# Patient Record
Sex: Female | Born: 1995
Health system: Southern US, Community
[De-identification: ages and names within clinical notes are randomized; demographics above are authoritative.]

## PROBLEM LIST (undated history)

## (undated) DIAGNOSIS — T7840XA Allergy, unspecified, initial encounter: Secondary | ICD-10-CM

## (undated) DIAGNOSIS — M419 Scoliosis, unspecified: Secondary | ICD-10-CM

## (undated) DIAGNOSIS — L309 Dermatitis, unspecified: Secondary | ICD-10-CM

## (undated) DIAGNOSIS — M545 Low back pain, unspecified: Secondary | ICD-10-CM

## (undated) DIAGNOSIS — G8929 Other chronic pain: Secondary | ICD-10-CM

## (undated) HISTORY — DX: Dermatitis, unspecified: L30.9

## (undated) HISTORY — DX: Allergy, unspecified, initial encounter: T78.40XA

## (undated) HISTORY — DX: Low back pain: M54.5

## (undated) HISTORY — DX: Low back pain, unspecified: M54.50

## (undated) HISTORY — DX: Scoliosis, unspecified: M41.9

## (undated) HISTORY — DX: Other chronic pain: G89.29

---
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
## 2017-07-24 ENCOUNTER — Encounter: Payer: Self-pay | Admitting: Pediatrics

## 2017-07-24 ENCOUNTER — Ambulatory Visit (INDEPENDENT_AMBULATORY_CARE_PROVIDER_SITE_OTHER): Payer: Medicaid Other | Admitting: Pediatrics

## 2017-07-24 DIAGNOSIS — M545 Low back pain, unspecified: Secondary | ICD-10-CM | POA: Insufficient documentation

## 2017-07-24 DIAGNOSIS — L309 Dermatitis, unspecified: Secondary | ICD-10-CM

## 2017-07-24 DIAGNOSIS — Z0001 Encounter for general adult medical examination with abnormal findings: Secondary | ICD-10-CM

## 2017-07-24 DIAGNOSIS — J309 Allergic rhinitis, unspecified: Secondary | ICD-10-CM | POA: Diagnosis not present

## 2017-07-24 DIAGNOSIS — R8290 Unspecified abnormal findings in urine: Secondary | ICD-10-CM

## 2017-07-24 DIAGNOSIS — G8929 Other chronic pain: Secondary | ICD-10-CM | POA: Insufficient documentation

## 2017-07-24 LAB — POCT URINALYSIS DIPSTICK
Bilirubin, UA: NEGATIVE
GLUCOSE UA: NEGATIVE
KETONES UA: NEGATIVE
Leukocytes, UA: NEGATIVE
Nitrite, UA: NEGATIVE
Protein, UA: NEGATIVE
RBC UA: 200
SPEC GRAV UA: 1.015 (ref 1.010–1.025)
Urobilinogen, UA: 0.2 E.U./dL
pH, UA: 6.5 (ref 5.0–8.0)

## 2017-07-24 MED ORDER — TRIAMCINOLONE ACETONIDE 0.1 % EX CREA: TOPICAL_CREAM | CUTANEOUS | 1 refills | Status: DC

## 2017-07-24 MED ORDER — CETIRIZINE HCL 10 MG PO TABS: ORAL_TABLET | ORAL | 11 refills | Status: DC

## 2017-07-24 NOTE — Patient Instructions (Signed)
Preventive Care for [place], Female The transition to life after high school as a young adult can be a stressful time with many changes. You may start seeing a primary care physician instead of a pediatrician. This is the time when your health care becomes your responsibility. Preventive care refers to lifestyle choices and visits with your health care provider that can promote health and wellness. What does preventive care include?  A yearly physical exam. This is also called an annual wellness visit.  Dental exams once or twice a year.  Routine eye exams. Ask your health care provider how often you should have your eyes checked.  Personal lifestyle choices, including: ? Daily care of your teeth and gums. ? Regular physical activity. ? Eating a healthy diet. ? Avoiding tobacco and drug use. ? Avoiding or limiting alcohol use. ? Practicing safe sex. ? Taking vitamin and mineral supplements as recommended by your health care provider. What happens during an annual wellness visit? Preventive care starts with a yearly visit to your primary care physician. The services and screenings done by your health care provider during your annual wellness visit will depend on your overall health, lifestyle risk factors, and family history of disease. Counseling Your health care provider may ask you questions about:  Past medical problems and your family's medical history.  Medicines or supplements you take.  Health insurance and access to health care.  Alcohol, tobacco, and drug use.  Your safety at home, work, or school.  Access to firearms.  Emotional well-being and how you cope with stress.  Relationship well-being.  Diet, exercise, and sleep habits.  Your sexual health and activity.  Your methods of birth control.  Your menstrual cycle.  Your pregnancy history.  Screening You may have the following tests or measurements:  Height, weight, and BMI.  Blood  pressure.  Lipid and cholesterol levels.  Tuberculosis skin test.  Skin exam.  Vision and hearing tests.  Screening test for hepatitis.  Screening tests for sexually transmitted diseases (STDs), if you are at risk.  BRCA-related cancer screening. This may be done if you have a family history of breast, ovarian, tubal, or peritoneal cancers.  Pelvic exam and Pap test. This may be done every 3 years starting at age 39.  Vaccines Your health care provider may recommend certain vaccines, such as:  Influenza vaccine. This is recommended every year.  Tetanus, diphtheria, and acellular pertussis (Tdap, Td) vaccine. You may need a Td booster every 10 years.  Varicella vaccine. You may need this if you have not been vaccinated.  HPV vaccine. If you are 6 or younger, you may need three doses over 6 months.  Measles, mumps, and rubella (MMR) vaccine. You may need at least one dose of MMR. You may also need a second dose.  Pneumococcal 13-valent conjugate (PCV13) vaccine. You may need this if you have certain conditions and were not previously vaccinated.  Pneumococcal polysaccharide (PPSV23) vaccine. You may need one or two doses if you smoke cigarettes or if you have certain conditions.  Meningococcal vaccine. One dose is recommended if you are age 103-21 years and a first-year college student living in a residence hall, or if you have one of several medical conditions. You may also need additional booster doses.  Hepatitis A vaccine. You may need this if you have certain conditions or if you travel or work in places where you may be exposed to hepatitis A.  Hepatitis B vaccine. You may need this if  you have certain conditions or if you travel or work in places where you may be exposed to hepatitis B.  Haemophilus influenzae type b (Hib) vaccine. You may need this if you have certain risk factors.  Talk to your health care provider about which screenings and vaccines you need and how  often you need them. What steps can I take to develop healthy behaviors?  Have regular preventive health care visits with your primary care physician and dentist.  Eat a healthy diet.  Drink enough fluid to keep your urine clear or pale yellow.  Stay active. Exercise at least 30 minutes 5 or more days of the week.  Use alcohol responsibly.  Maintain a healthy weight.  Do not use any products that contain nicotine, such as cigarettes, chewing tobacco, and e-cigarettes. If you need help quitting, ask your health care provider.  Do not use drugs.  Practice safe sex.  Use birth control (contraception) to prevent unwanted pregnancy. If you plan to become pregnant, see your health care provider for a pre-conception visit.  Find healthy ways to manage stress. How can I protect myself from injury? Injuries from violence or accidents are the leading cause of death among young adults and can often be prevented. Take these steps to help protect yourself:  Always wear your seat belt while driving or riding in a vehicle.  Do not drive if you have been drinking alcohol. Do not ride with someone who has been drinking.  Do not drive when you are tired or distracted. Do not text while driving.  Wear a helmet and other protective equipment during sports activities.  If you have firearms in your house, make sure you follow all gun safety procedures.  Seek help if you have been bullied, physically abused, or sexually abused.  Use the Internet responsibly to avoid dangers such as online bullying and online sexual predators.  What can I do to cope with stress? Young adults may face many new challenges that can be stressful, such as finding a job, going to college, moving away from home, managing money, being in a relationship, getting married, and having children. To manage stress:  Avoid known stressful situations when you can.  Exercise regularly.  Find a stress-reducing activity that  works best for you. Examples include meditation, yoga, listening to music, or reading.  Spend time in nature.  Keep a journal to write about your stress and how you respond.  Talk to your health care provider about stress. He or she may suggest counseling.  Spend time with supportive friends or family.  Do not cope with stress by: ? Drinking alcohol or using drugs. ? Smoking cigarettes. ? Eating.  Where can I get more information? Learn more about preventive care and healthy habits from:  [place] and Gynecologists: KaraokeExchange.nl  U.S. Probation officer Task Force: StageSync.si  National Adolescent and [place]: StrategicRoad.nl  American Academy of Pediatrics Bright Futures: https://brightfutures.MemberVerification.co.za  Society for Adolescent Health and Medicine: MoralBlog.co.za.aspx  PodExchange.nl: ToyLending.fr  This information is not intended to replace advice given to you by your health care provider. Make sure you discuss any questions you have with your health care provider. Document Released: 03/28/2016 Document Revised: 04/18/2016 Document Reviewed: 03/28/2016 Elsevier Interactive Patient Education  2017 [employer].

## 2017-07-24 NOTE — Progress Notes (Signed)
Adolescent Well Care Visit Melissa Logan is a 21 y.o. female who is here for well care.    PCP:  McDonell, Alfredia ClientMary Jo, MD   History was provided by the patient.  Confidentiality was discussed with the patient and, if applicable, with caregiver as well.   Current Issues: Current concerns include has had cloudy appearing urine for the past one week. She states that she has not had any dysuria or urinary frequency. She has not had sex recently or prior to the symptoms starting.   She continues to have the same lower back pain that she mentioned one year ago, and this has been present for about 3 years. She states that the pain is still on both lower sides of her back, and occurs almost daily. She states that she thought she saw someone in [place], [place] and had an xray done, but, never was told a plan regarding her back?  She does need a refill of her allergy medicine and eczema cream.   Nutrition: Nutrition/Eating Behaviors: does not eat fruits and veggies often  Adequate calcium in diet?: no  Supplements/ Vitamins:  No   Exercise/ Media: Play any Sports?/ Exercise: no  Screen Time:  < 2 hours Media Rules or Monitoring?: no  Sleep:  Sleep: normal   Social Screening: Lives with:  Parents, sibling Parental relations:  good Activities, Work, and Regulatory affairs officerChores?: works at [employer] 40 hours per week  Concerns regarding behavior with peers?  no Stressors of note: no  Education: Menstruation:   No LMP recorded. Menstrual History: currently on period, they are monthly, no heavy bleeding or cramps    Confidential Social History: Tobacco?  no Secondhand smoke exposure?  no Drugs/ETOH?  yes, alcohol with friends   Sexually Active?  yes   Pregnancy Prevention: condoms  Safe at home, in school & in relationships?  Yes Safe to self?  Yes   Screenings: Patient has a dental home: yes   PHQ-9 completed and results indicated 5  Physical Exam:  Vitals:   07/24/17 1344  BP: 118/70  Temp:  97.8 F (36.6 C)  TempSrc: Temporal  Weight: 125 lb 9.6 oz (57 kg)  Height: 5' 4.27" (1.633 m)   BP 118/70   Temp 97.8 F (36.6 C) (Temporal)   Ht 5' 4.27" (1.633 m)   Wt 125 lb 9.6 oz (57 kg)   BMI 21.38 kg/m  Body mass index: body mass index is 21.38 kg/m. Blood pressure percentiles are 74 % systolic and 69 % diastolic based on the August 2017 AAP Clinical Practice Guideline. Blood pressure percentile targets: 90: 126/78, 95: 129/81, 95 + 12 mmHg: 141/93.   Hearing Screening   125Hz  250Hz  500Hz  1000Hz  2000Hz  3000Hz  4000Hz  6000Hz  8000Hz   Right ear:   20 20 20 20 20     Left ear:   20 20 20 20 20       Visual Acuity Screening   Right eye Left eye Both eyes  Without correction: 20/25` 20/20   With correction:       General Appearance:   alert, oriented, no acute distress  HENT: Normocephalic, no obvious abnormality, conjunctiva clear  Mouth:   Normal appearing teeth, no obvious discoloration, dental caries, or dental caps  Neck:   Supple; thyroid: no enlargement, symmetric, no tenderness/mass/nodules  Chest Normal   Lungs:   Clear to auscultation bilaterally, normal work of breathing  Heart:   Regular rate and rhythm, S1 and S2 normal, no murmurs;   Abdomen:  Soft, non-tender, no mass, or organomegaly  GU genitalia not examined - on period today   Musculoskeletal:   Tone and strength strong and symmetrical, all extremities               Lymphatic:   No cervical adenopathy  Skin/Hair/Nails:   Skin warm, dry and intact, no rashes, no bruises or petechiae  Neurologic:   Strength, gait, and coordination normal and age-appropriate     Assessment and Plan:   21 year old adolescent visit   .1. Encounter for general adult medical examination with abnormal findings  - GC/Chlamydia Probe Amp  2. Chronic bilateral low back pain without sciatica Discussed importance of this visit, since the pain has been present for 3 years and patient unsure about what happened during last  visit with Ortho in [place]?  - Ambulatory referral to Orthopedic Surgery  3. Cloudy urine No other UTI symptoms, Urine dip - normal except for blood - consistent with being on period  - POCT urinalysis dipstick - Urine Culture pending   4. Eczema, unspecified type - triamcinolone cream (KENALOG) 0.1 %; Pharmacy: Mix 3:1 with Eucerin. Patient: Apply to eczema twice a day on eczema for up to one week as needed. Do not use face  Dispense: 454 g; Refill: 1  5. Allergic rhinitis, unspecified seasonality, unspecified trigger  - cetirizine (ZYRTEC) 10 MG tablet; Take one tablet at night for allergies  Dispense: 30 tablet; Refill: 11   BMI is appropriate for age  Hearing screening result:normal Vision screening result: normal  Counseling provided for all of the vaccine components  Orders Placed This Encounter  Procedures  . GC/Chlamydia Probe Amp  . Urine Culture  . Ambulatory referral to Orthopedic Surgery  . POCT urinalysis dipstick     Return in 1 year (on 07/24/2018).Rosiland Oz, MD

## 2017-07-25 LAB — URINE CULTURE

## 2017-07-25 LAB — GC/CHLAMYDIA PROBE AMP
Chlamydia trachomatis, NAA: NEGATIVE
Neisseria gonorrhoeae by PCR: NEGATIVE

## 2017-09-09 IMAGING — US US RENAL
1 series · 14 of 25 positions shown · non-contrast
Comparison: 06/19/2015.

CLINICAL DATA: Back pain.  Frequency.

EXAM:
RENAL / URINARY TRACT ULTRASOUND COMPLETE

[Series 1: us renal · 0.23mm/px · 14 of 44 slices shown]
[im 1/44]
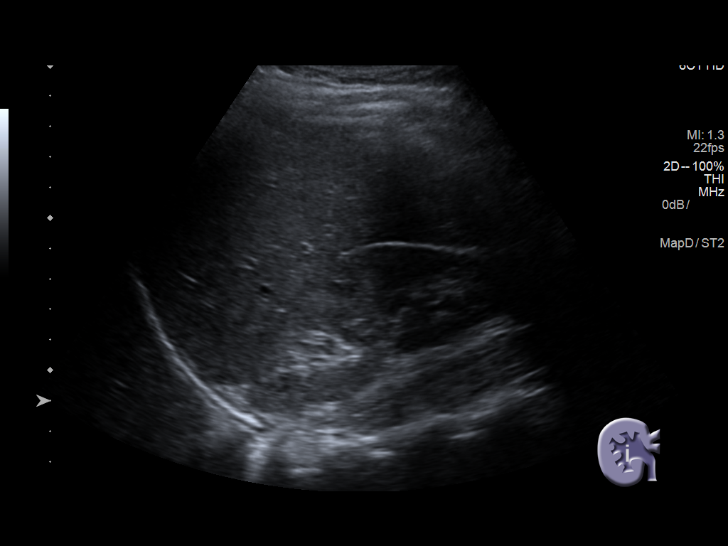
[im 4/44]
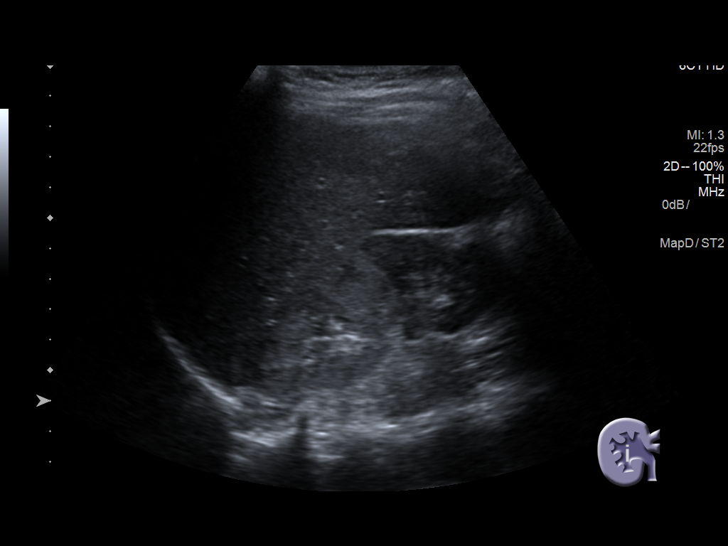
[im 8/44]
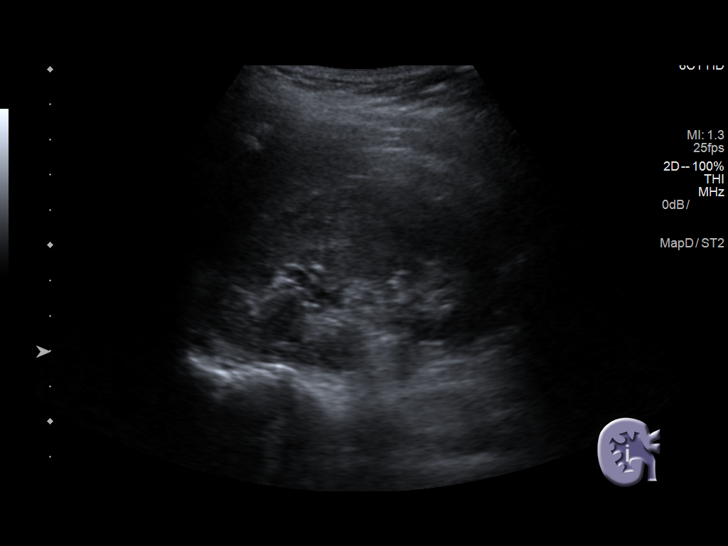
[im 11/44]
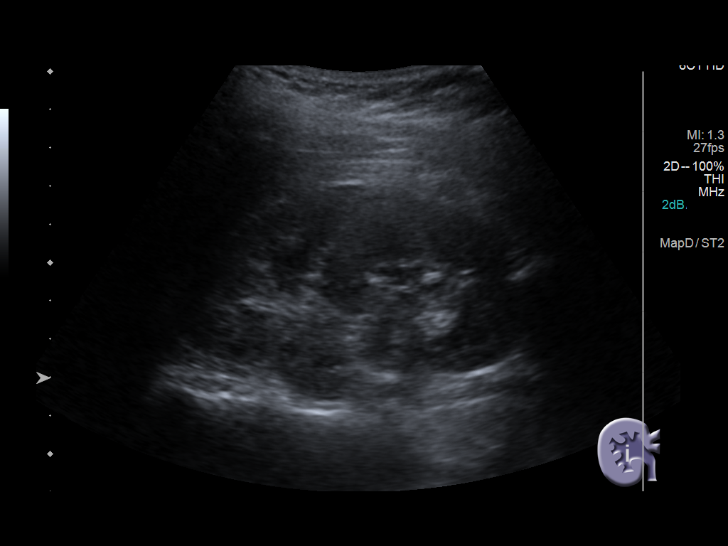
[im 15/44]
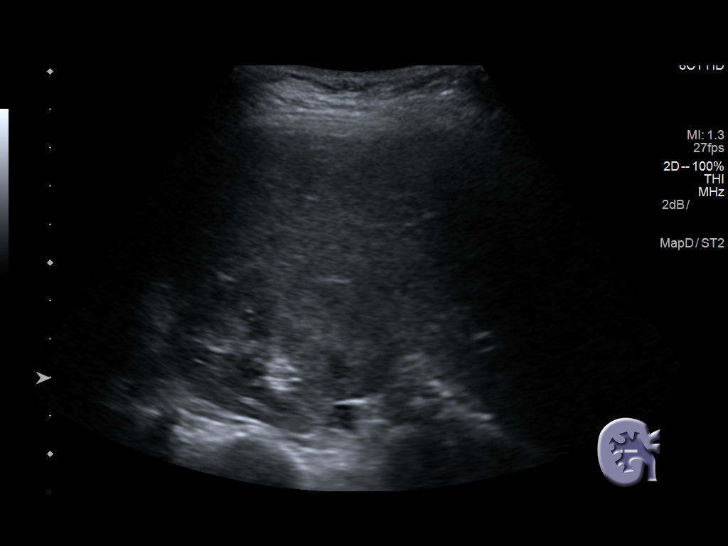
[im 17/44]
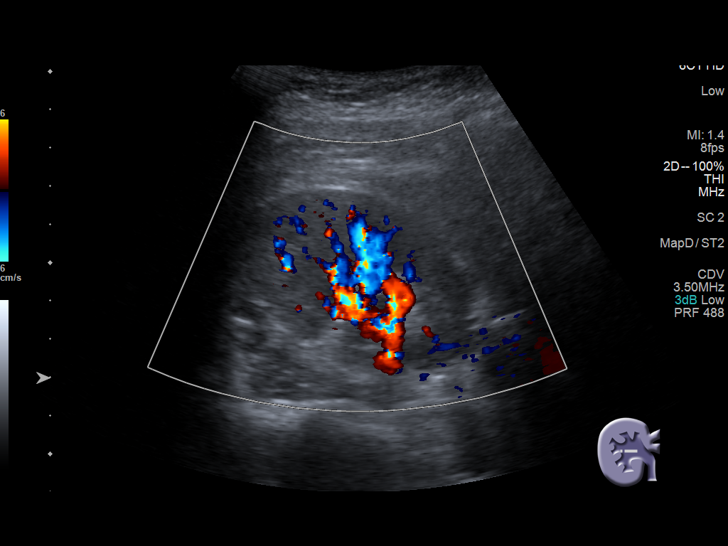
[im 20/44]
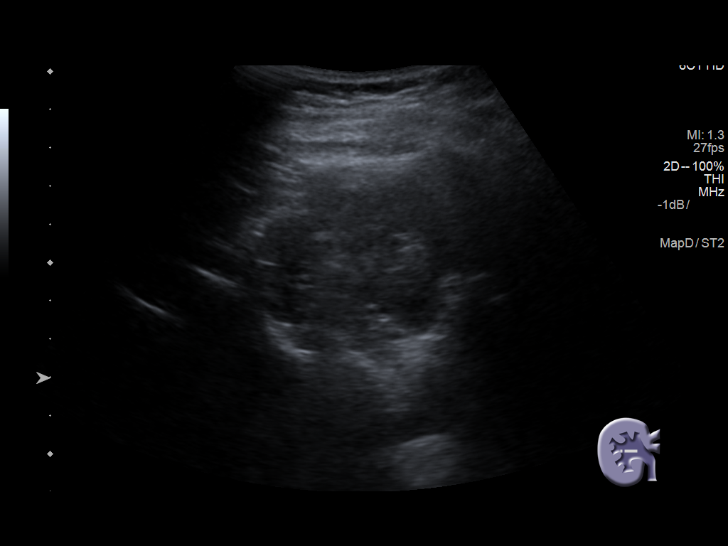
[im 24/44]
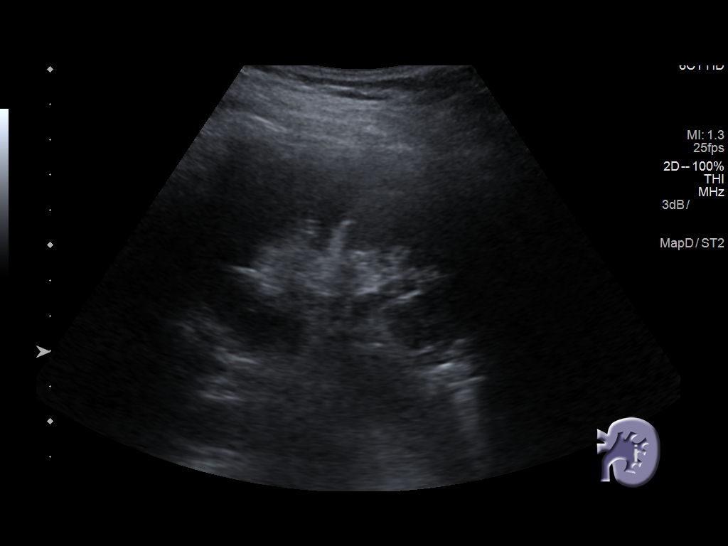
[im 27/44]
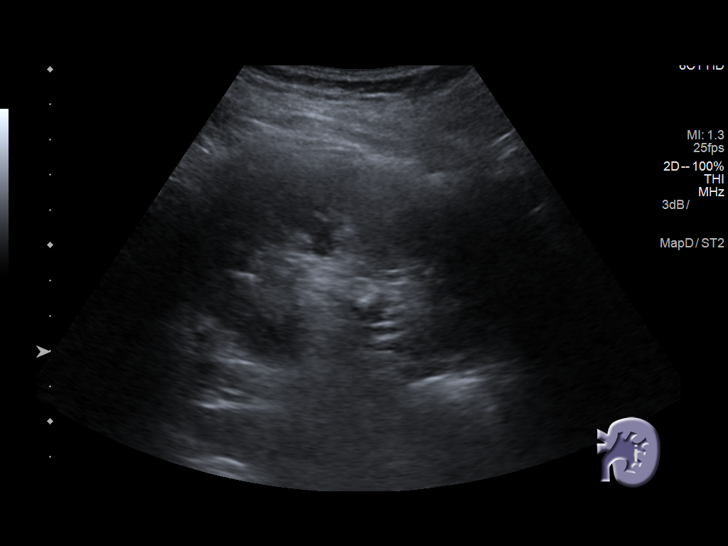
[im 29/44]
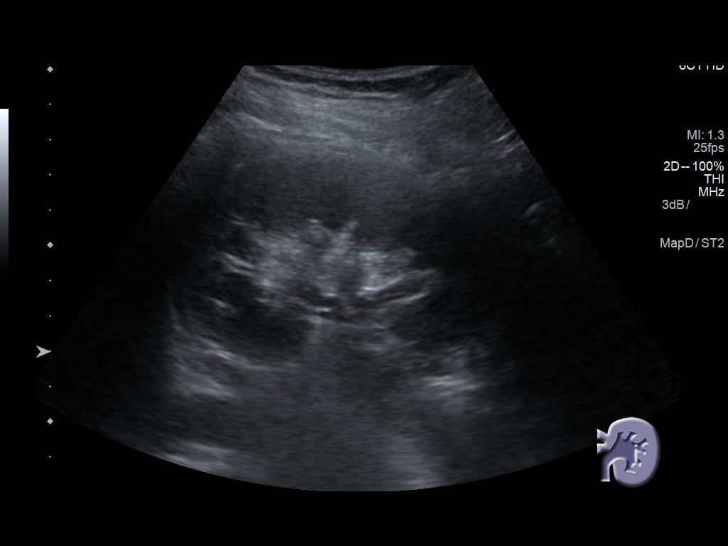
[im 33/44]
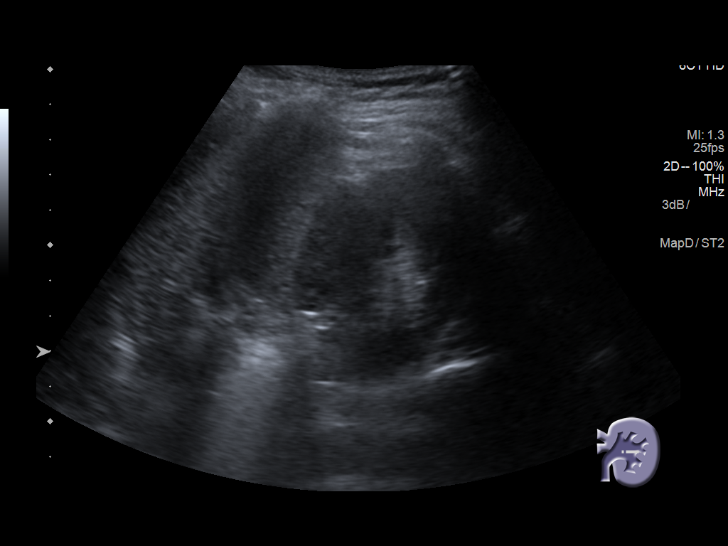
[im 36/44]
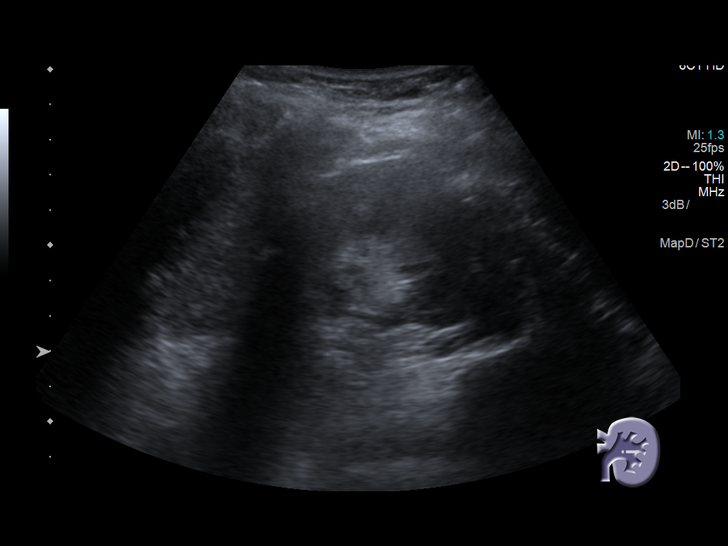
[im 40/44]
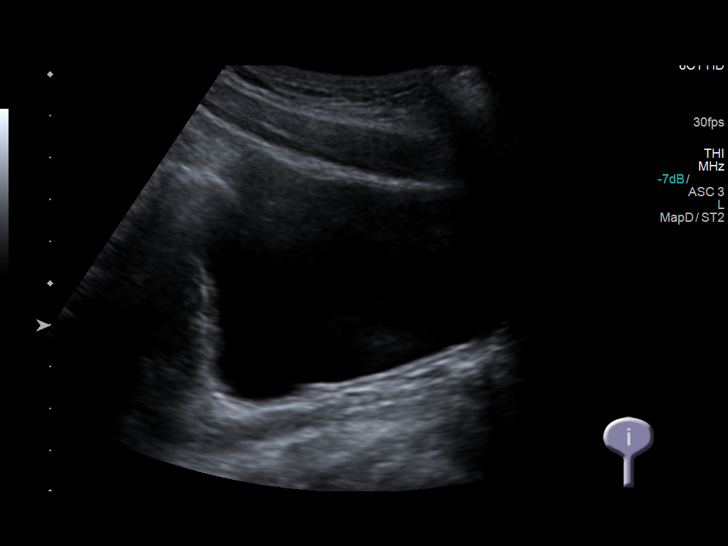
[im 44/44]
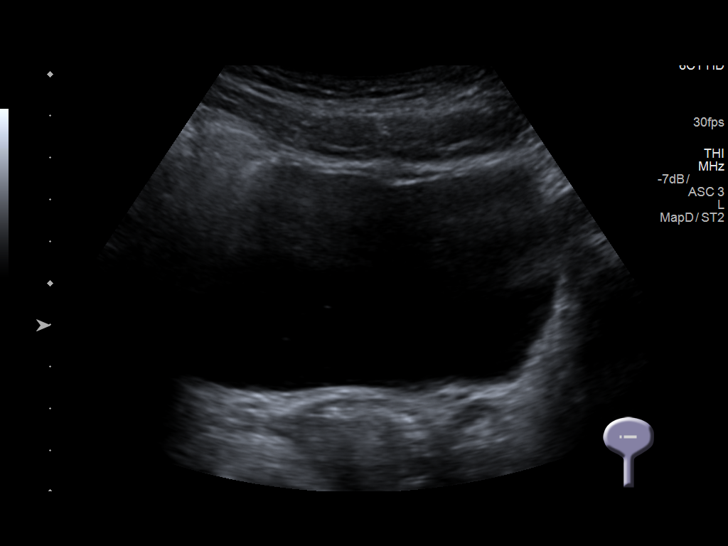

[14 of 25 positions shown; findings below may reference images not displayed]

FINDINGS: Right Kidney:

Length: 9.7 cm. Echogenicity within normal limits. No mass or
hydronephrosis visualized.

Left Kidney:

Length: 9.7 cm. Echogenicity within normal limits. No mass or
hydronephrosis visualized.

Normal length for age is 10.5 cm plus or minus

Bladder:

Appears normal for degree of bladder distention.
IMPRESSION: Negative exam.

## 2017-09-19 ENCOUNTER — Encounter: Payer: Self-pay | Admitting: Orthopedic Surgery

## 2017-10-27 ENCOUNTER — Encounter: Payer: Self-pay | Admitting: Pediatrics

## 2018-03-09 ENCOUNTER — Ambulatory Visit (INDEPENDENT_AMBULATORY_CARE_PROVIDER_SITE_OTHER): Payer: Self-pay | Admitting: Advanced Practice Midwife

## 2018-03-09 ENCOUNTER — Encounter: Payer: Self-pay | Admitting: Advanced Practice Midwife

## 2018-03-09 VITALS — BP 105/76 | HR 79 | Ht 64.0 in | Wt 120.7 lb

## 2018-03-09 DIAGNOSIS — L309 Dermatitis, unspecified: Secondary | ICD-10-CM

## 2018-03-09 MED ORDER — TRIAMCINOLONE ACETONIDE 0.1 % EX OINT: 1.0000 "application " | TOPICAL_OINTMENT | Freq: Two times a day (BID) | CUTANEOUS | 3 refills | Status: DC

## 2018-03-09 NOTE — Progress Notes (Signed)
Pt reports having irritation and itching of bilateral groin area as well as vaginal odor with the normal discharge. She has been using triple antibiotic ointment to the groin area without relief. She has not been sexually active for 2-3 months.

## 2018-03-09 NOTE — Patient Instructions (Signed)
[facility]  [address]  [place], [place] [id number]  [phone]

## 2018-03-09 NOTE — Progress Notes (Signed)
Subjective:     Patient ID: Melissa Logan, female   DOB: [birth year], 22 y.o.   MRN: [id number]  Melissa Logan is a 22 y.o. G0P0000 who is here today with a rash on her groin for several months. She reports that it is dry and itchy. She has tried neosporin on the area, but it has not helped. She has a history of eczema.   Other  This is a new problem. The current episode started more than 1 month ago. The problem occurs constantly. The problem has been gradually worsening. Associated symptoms include a rash. Pertinent negatives include no chills, fever, nausea or vomiting. Nothing aggravates the symptoms. Treatments tried: neosporin  The treatment provided no relief.     Review of Systems  Constitutional: Negative for chills and fever.  Gastrointestinal: Negative for nausea and vomiting.  Skin: Positive for rash.       Objective:   Physical Exam  Constitutional: She is oriented to person, place, and time. She appears well-developed. No distress.  HENT:  Head: Normocephalic.  Cardiovascular: Normal rate.  Pulmonary/Chest: Effort normal.  Abdominal: Soft. There is no tenderness.  Neurological: She is alert and oriented to person, place, and time.  Skin: Skin is warm and dry. Rash noted. Rash is maculopapular.     Psychiatric: She has a normal mood and affect.  Nursing note and vitals reviewed.  Assessment:     1. Eczema, unspecified type    Plan:     Kenalog BID #1 with 3 refills Patient to see derm if no improvement.     Thressa ShellerHeather Jaycey Gens 6:58 PM 03/09/18

## 2018-08-09 ENCOUNTER — Other Ambulatory Visit: Payer: Self-pay

## 2018-08-09 ENCOUNTER — Emergency Department (HOSPITAL_COMMUNITY)
Admission: EM | Admit: 2018-08-09 | Discharge: 2018-08-10 | Disposition: A | Discharge status: Home / Self Care | Payer: Medicaid Other | Attending: Emergency Medicine | Admitting: Emergency Medicine

## 2018-08-09 ENCOUNTER — Encounter (HOSPITAL_COMMUNITY): Payer: Self-pay | Admitting: *Deleted

## 2018-08-09 DIAGNOSIS — L739 Follicular disorder, unspecified: Secondary | ICD-10-CM | POA: Insufficient documentation

## 2018-08-09 DIAGNOSIS — Z79899 Other long term (current) drug therapy: Secondary | ICD-10-CM | POA: Insufficient documentation

## 2018-08-09 DIAGNOSIS — F17200 Nicotine dependence, unspecified, uncomplicated: Secondary | ICD-10-CM | POA: Insufficient documentation

## 2018-08-09 NOTE — ED Triage Notes (Signed)
Pt thinks she may have been bitten by something on her left lower leg. She noticed area of redness and burning last night to the left lower leg. No drainage. No meds PTA.

## 2018-08-10 MED ORDER — SULFAMETHOXAZOLE-TRIMETHOPRIM 800-160 MG PO TABS
1.0000 | ORAL_TABLET | Freq: Once | ORAL | Status: AC
  Administered 2018-08-10: 1 via ORAL
  Filled 2018-08-10: qty 1

## 2018-08-10 MED ORDER — SULFAMETHOXAZOLE-TRIMETHOPRIM 800-160 MG PO TABS: 1.0000 | ORAL_TABLET | Freq: Two times a day (BID) | ORAL | 0 refills | Status: AC

## 2018-08-10 NOTE — ED Provider Notes (Signed)
[facility]-EMERGENCY DEPT Provider Note   CSN: [id number] Arrival date & time: 08/09/18  2043     History   Chief Complaint Chief Complaint  Patient presents with  . Insect Bite    HPI Melissa Logan is a 22 y.o. female.  Patient presents to the emergency department for evaluation of swollen area on her left lower leg.  It started as a small bump yesterday, now is red, swollen with a white blister in the center.  She is concerned that she might of been bitten by something.     Past Medical History:  Diagnosis Date  . Allergy   . Chronic low back pain   . Eczema   . Scoliosis     Patient Active Problem List   Diagnosis Date Noted  . Chronic bilateral low back pain without sciatica 07/24/2017  . Acne 06/19/2015  . Eczema 06/19/2015  . Allergic rhinitis 06/19/2015  . Slow transit constipation 06/19/2015    History reviewed. No pertinent surgical history.   OB History    Gravida  0   Para  0   Term  0   Preterm  0   AB  0   Living  0     SAB  0   TAB  0   Ectopic  0   Multiple  0   Live Births  0            Home Medications    Prior to Admission medications   Medication Sig Start Date End Date Taking? Authorizing Provider  cetirizine (ZYRTEC) 10 MG tablet Take one tablet at night for allergies Patient not taking: Reported on 03/09/2018 07/24/17   Rosiland Oz, MD  ISOtretinoin (ACCUTANE) 30 MG capsule Take 30 mg by mouth 2 (two) times daily.    [provider]  neomycin-bacitracin-polymyxin (NEOSPORIN) OINT Apply 1 application topically 3 (three) times daily.    [provider]  sulfamethoxazole-trimethoprim (BACTRIM DS,SEPTRA DS) 800-160 MG tablet Take 1 tablet by mouth 2 (two) times daily for 7 days. 08/10/18 08/17/18  Gilda Crease, MD  triamcinolone ointment (KENALOG) 0.1 % Apply 1 application topically 2 (two) times daily. 03/09/18   Armando Reichert, CNM    Family History Family  History  Problem Relation Age of Onset  . Healthy Mother   . Healthy Father   . Healthy Sister   . Muscular dystrophy Cousin     Social History Social History   Tobacco Use  . Smoking status: Current Some Day Smoker  . Smokeless tobacco: Never Used  Substance Use Topics  . Alcohol use: Yes    Alcohol/week: 0.0 standard drinks  . Drug use: Not Currently     Allergies   Patient has no known allergies.   Review of Systems Review of Systems  Constitutional: Negative for fever.  Skin: Positive for wound.     Physical Exam Updated Vital Signs BP 121/86   Pulse 85   Temp 98.3 F (36.8 C) (Oral)   Resp 16   LMP 07/21/2018   SpO2 100%   Physical Exam  Constitutional: She appears well-developed.  HENT:  Head: Atraumatic.  Pulmonary/Chest: Effort normal.  Musculoskeletal: Normal range of motion. She exhibits tenderness (Left lower leg). She exhibits no deformity.  Skin:        ED Treatments / Results  Labs (all labs ordered are listed, but only abnormal results are displayed) Labs Reviewed - No data to display  EKG None  Radiology No results found.  Procedures Procedures (including critical care time)  Medications Ordered in ED Medications  sulfamethoxazole-trimethoprim (BACTRIM DS,SEPTRA DS) 800-160 MG per tablet 1 tablet (has no administration in time range)     Initial Impression / Assessment and Plan / ED Course  I have reviewed the triage vital signs and the nursing notes.  Pertinent labs & imaging results that were available during my care of the patient were reviewed by me and considered in my medical decision making (see chart for details).     Patient with likely skin infection, probably folliculitis.  No drainable abscess.  Final Clinical Impressions(s) / ED Diagnoses   Final diagnoses:  Folliculitis    ED Discharge Orders         Ordered    sulfamethoxazole-trimethoprim (BACTRIM DS,SEPTRA DS) 800-160 MG tablet  2 times daily      08/10/18 0036           Gilda CreasePollina, Lyrika Souders J, MD 08/10/18 [phone]

## 2018-09-20 ENCOUNTER — Encounter (HOSPITAL_COMMUNITY): Payer: Self-pay

## 2018-09-20 ENCOUNTER — Other Ambulatory Visit: Payer: Self-pay

## 2018-09-20 ENCOUNTER — Emergency Department (HOSPITAL_COMMUNITY): Payer: Self-pay

## 2018-09-20 ENCOUNTER — Emergency Department (HOSPITAL_COMMUNITY)
Admission: EM | Admit: 2018-09-20 | Discharge: 2018-09-20 | Disposition: A | Discharge status: Home / Self Care | Payer: Self-pay | Attending: Emergency Medicine | Admitting: Emergency Medicine

## 2018-09-20 DIAGNOSIS — F17210 Nicotine dependence, cigarettes, uncomplicated: Secondary | ICD-10-CM | POA: Insufficient documentation

## 2018-09-20 DIAGNOSIS — J45909 Unspecified asthma, uncomplicated: Secondary | ICD-10-CM | POA: Insufficient documentation

## 2018-09-20 DIAGNOSIS — R569 Unspecified convulsions: Secondary | ICD-10-CM | POA: Insufficient documentation

## 2018-09-20 LAB — CBC
HCT: 42.3 % (ref 36.0–46.0)
Hemoglobin: 13.8 g/dL (ref 12.0–15.0)
MCH: 29.6 pg (ref 26.0–34.0)
MCHC: 32.6 g/dL (ref 30.0–36.0)
MCV: 90.6 fL (ref 80.0–100.0)
Platelets: 461 10*3/uL — ABNORMAL HIGH (ref 150–400)
RBC: 4.67 MIL/uL (ref 3.87–5.11)
RDW: 12.6 % (ref 11.5–15.5)
WBC: 7.6 10*3/uL (ref 4.0–10.5)
nRBC: 0 % (ref 0.0–0.2)

## 2018-09-20 LAB — BASIC METABOLIC PANEL
Anion gap: 11 (ref 5–15)
BUN: 11 mg/dL (ref 6–20)
CO2: 23 mmol/L (ref 22–32)
Calcium: 9.6 mg/dL (ref 8.9–10.3)
Chloride: 106 mmol/L (ref 98–111)
Creatinine, Ser: 0.62 mg/dL (ref 0.44–1.00)
GFR calc Af Amer: >60 mL/min (ref >60)
GFR calc non Af Amer: >60 mL/min (ref >60)
Glucose, Bld: 91 mg/dL (ref 70–99)
Potassium: 4.3 mmol/L (ref 3.5–5.1)
Sodium: 140 mmol/L (ref 135–145)

## 2018-09-20 LAB — I-STAT BETA HCG BLOOD, ED (MC, WL, AP ONLY): I-stat hCG, quantitative: <5 m[IU]/mL (ref <5)

## 2018-09-20 LAB — CBG MONITORING, ED: Glucose-Capillary: 81 mg/dL (ref 70–99)

## 2018-09-20 NOTE — ED Triage Notes (Signed)
Patient brought in via POV by friend. Patient stated she does not have seizures. Patient was at friends house standing outside with family member and had seizure. Patient friend stated that patients eyes rolled to back of head, grinding teeth and patient urinated on herself. Seizure last 3-5 minutes. Patient did not fall, patient was caught by friend and lowered to ground. Patient is currently AOx4.

## 2018-09-20 NOTE — ED Notes (Signed)
Bed: WA01 Expected date:  Expected time:  Means of arrival:  Comments: HOLD FOR TRIAGE 1 

## 2018-09-20 NOTE — ED Provider Notes (Signed)
[facility]-EMERGENCY DEPT Provider Note   CSN: [id number] Arrival date & time: 09/20/18  1640     History   Chief Complaint No chief complaint on file.   HPI Melissa Logan is a 22 y.o. female.  HPI   22 year old female with seizure-like activity.  Patient is amnestic to events.  It witnessed by her friend at bedside.  Patient seem to be in her usual state of health until she "began to look funny."  Her eyes rolled back of her head and she seemed unsteady.  Friends sister to the ground.  She is grinding her teeth and urinated on herself.  They estimate that this episode lasted a couple minutes.  After it stopped she was very confused.  Patient does not remember any of this.  She is currently complaining of feeling tired and sore everywhere.  She reports that she is felt like she is at her usual state of health the past several days.  No history of seizures or other similar type episodes.  Past Medical History:  Diagnosis Date  . Allergy   . Chronic low back pain   . Eczema   . Scoliosis     Patient Active Problem List   Diagnosis Date Noted  . Chronic bilateral low back pain without sciatica 07/24/2017  . Acne 06/19/2015  . Eczema 06/19/2015  . Allergic rhinitis 06/19/2015  . Slow transit constipation 06/19/2015    History reviewed. No pertinent surgical history.   OB History    Gravida  0   Para  0   Term  0   Preterm  0   AB  0   Living  0     SAB  0   TAB  0   Ectopic  0   Multiple  0   Live Births  0            Home Medications    Prior to Admission medications   Medication Sig Start Date End Date Taking? Authorizing Provider  cetirizine (ZYRTEC) 10 MG tablet Take one tablet at night for allergies Patient not taking: Reported on 03/09/2018 07/24/17   Rosiland Oz, MD  triamcinolone ointment (KENALOG) 0.1 % Apply 1 application topically 2 (two) times daily. Patient not taking: Reported on 09/20/2018 03/09/18    Armando Reichert, CNM    Family History Family History  Problem Relation Age of Onset  . Healthy Mother   . Healthy Father   . Healthy Sister   . Muscular dystrophy Cousin     Social History Social History   Tobacco Use  . Smoking status: Current Some Day Smoker  . Smokeless tobacco: Never Used  Substance Use Topics  . Alcohol use: Not Currently    Alcohol/week: 0.0 standard drinks  . Drug use: Not Currently     Allergies   Nickel   Review of Systems Review of Systems  All systems reviewed and negative, other than as noted in HPI.  Physical Exam Updated Vital Signs BP 114/79   Pulse 74   Temp 98.7 F (37.1 C) (Oral)   Resp 14   Ht 5\' 4"  (1.626 m)   Wt 54.4 kg   SpO2 100%   BMI 20.60 kg/m   Physical Exam  Constitutional: She is oriented to person, place, and time. She appears well-developed and well-nourished. No distress.  HENT:  Head: Normocephalic and atraumatic.  Eyes: Pupils are equal, round, and reactive to light. Conjunctivae and EOM are normal.  Right eye exhibits no discharge. Left eye exhibits no discharge.  Neck: Neck supple.  No nuchal rigidity  Cardiovascular: Normal rate, regular rhythm and normal heart sounds. Exam reveals no gallop and no friction rub.  No murmur heard. Pulmonary/Chest: Effort normal and breath sounds normal. No respiratory distress.  Abdominal: Soft. She exhibits no distension. There is no tenderness.  Musculoskeletal: She exhibits no edema or tenderness.  Neurological: She is alert and oriented to person, place, and time. No cranial nerve deficit. She exhibits normal muscle tone. Coordination normal.  Skin: Skin is warm and dry.  Psychiatric: She has a normal mood and affect. Her behavior is normal. Thought content normal.  Nursing note and vitals reviewed.    ED Treatments / Results  Labs (all labs ordered are listed, but only abnormal results are displayed) Labs Reviewed  CBC - Abnormal; Notable for the following  components:      Result Value   Platelets 461 (*)    All other components within normal limits  BASIC METABOLIC PANEL  CBG MONITORING, ED  I-STAT BETA HCG BLOOD, ED ([facility], [facility], [facility] ONLY)    EKG EKG Interpretation  Date/Time:  "Sunday September 20 2018 17:43:39 EDT Ventricular Rate:  79 PR Interval:    QRS Duration: 81 QT Interval:  365 QTC Calculation: 419 R Axis:   98 Text Interpretation:  Sinus rhythm Borderline short PR interval Borderline right axis deviation Confirmed by Sidi Dzikowski ([id number]) on 09/20/2018 8:25:02 PM   Radiology Ct Head Wo Contrast  Result Date: 09/20/2018 CLINICAL DATA:  Seizure. EXAM: CT HEAD WITHOUT CONTRAST TECHNIQUE: Contiguous axial images were obtained from the base of the skull through the vertex without intravenous contrast. COMPARISON:  None. FINDINGS: Brain: No evidence of acute infarction, hemorrhage, hydrocephalus, extra-axial collection or mass lesion/mass effect. Vascular: No hyperdense vessel or unexpected calcification. Skull: Normal. Negative for fracture or focal lesion. Sinuses/Orbits: No acute finding. Other: None. IMPRESSION: No cause for the patient's seizure/symptoms identified. Electronically Signed   By: David  Williams III M.D   On: 09/20/2018 19:56    Procedures Procedures (including critical care time)  Medications Ordered in ED Medications - No data to display   Initial Impression / Assessment and Plan / ED Course  I have reviewed the triage vital signs and the nursing notes.  Pertinent labs & imaging results that were available during my care of the patient were reviewed by me and considered in my medical decision making (see chart for details).     22"  year old female with what sounds like new onset seizure.  She is still mildly drowsy but more or less back to her baseline.  Afebrile.  He dynamically stable.  Nonfocal neurological examination.  CT the head without acute abnormality.  Basic labs unremarkable.  Will defer from  starting her on antiepileptic with a single event.  She needs to follow-up with neurology.  Driving instructions were discussed.  Emergent return precautions discussed as well.  Try to get plenty of rest and general health maintenance.  Final Clinical Impressions(s) / ED Diagnoses   Final diagnoses:  New onset seizure [facility])    ED Discharge Orders    None       Raeford Razor, MD 09/20/18 2057

## 2018-09-20 NOTE — ED Notes (Signed)
ED Provider at bedside. 

## 2018-09-20 NOTE — ED Notes (Signed)
EDEKG saved for pt if needed.

## 2018-09-20 NOTE — ED Notes (Signed)
Patient transported to CT 

## 2019-05-06 ENCOUNTER — Other Ambulatory Visit: Payer: Self-pay

## 2019-05-06 ENCOUNTER — Other Ambulatory Visit: Payer: Medicaid Other

## 2019-05-06 DIAGNOSIS — Z20822 Contact with and (suspected) exposure to covid-19: Secondary | ICD-10-CM

## 2019-05-08 LAB — NOVEL CORONAVIRUS, NAA: SARS-CoV-2, NAA: NOT DETECTED

## 2019-09-18 IMAGING — CT CT HEAD W/O CM
3 series · 15 of 46 positions shown, 18 images · non-contrast
Comparison: None.

CLINICAL DATA: Seizure.

EXAM:
CT HEAD WITHOUT CONTRAST
TECHNIQUE: Contiguous axial images were obtained from the base of the skull
through the vertex without intravenous contrast.

[Series 2: head wo · axial · 0.42mm/px · z∈[-205,-85]mm · 9 of 29 slices shown, 12 images]
[im 3/29  brain]
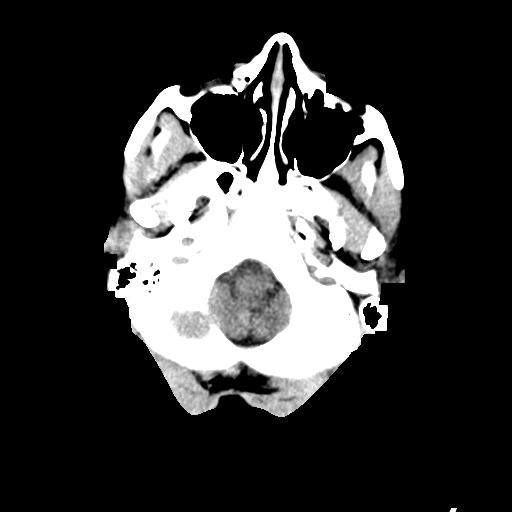
[im 3/29  bone]
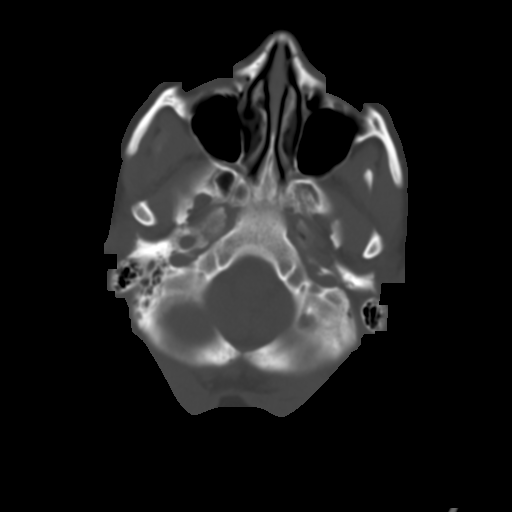
[im 6/29  brain]
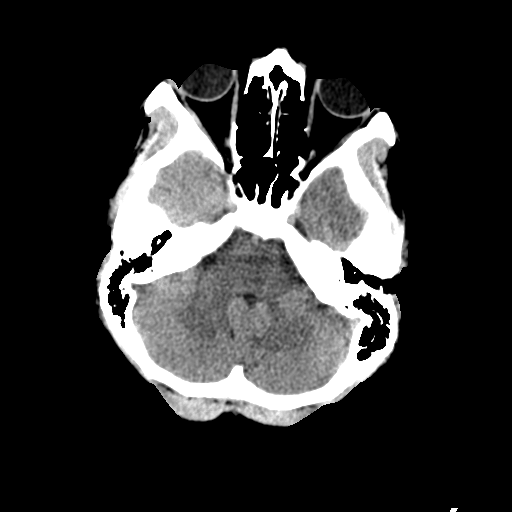
[im 9/29  brain]
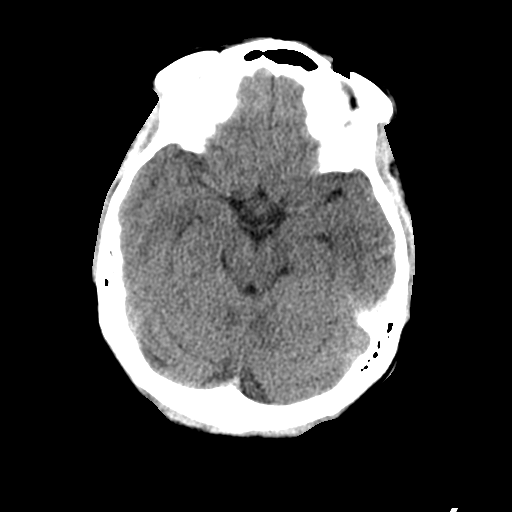
[im 12/29  brain]
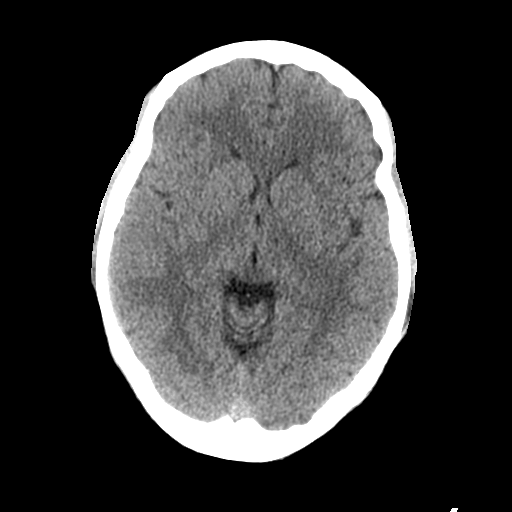
[im 15/29  brain]
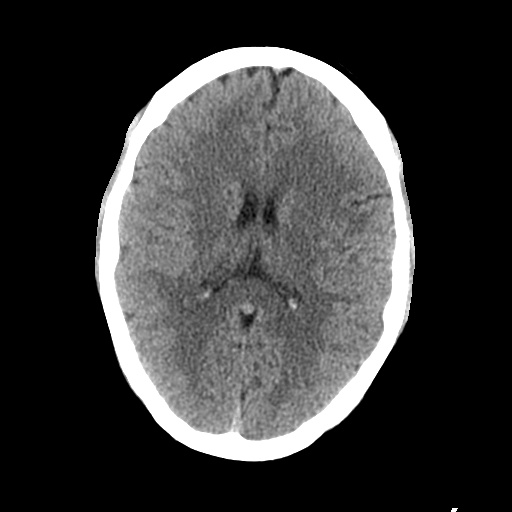
[im 15/29  bone]
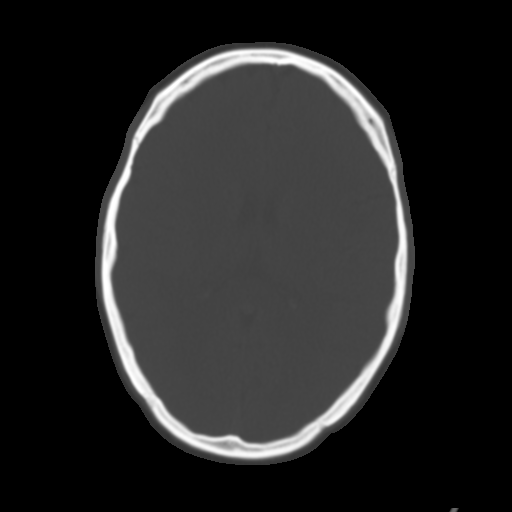
[im 18/29  brain]
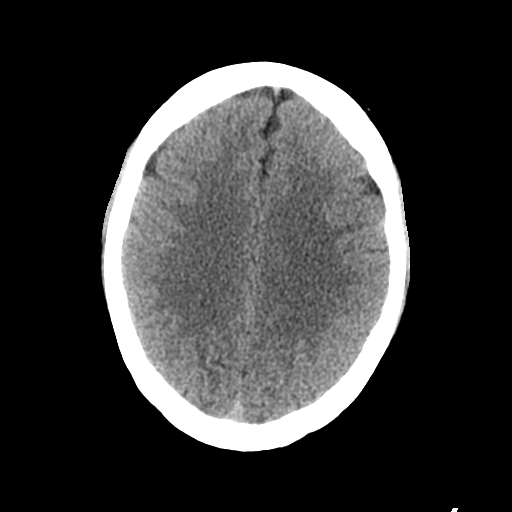
[im 21/29  brain]
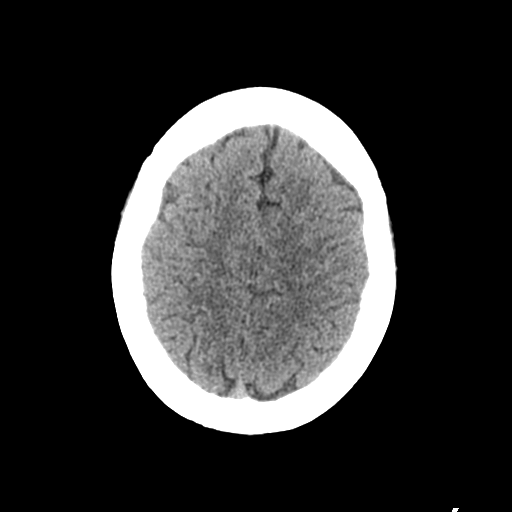
[im 24/29  brain]
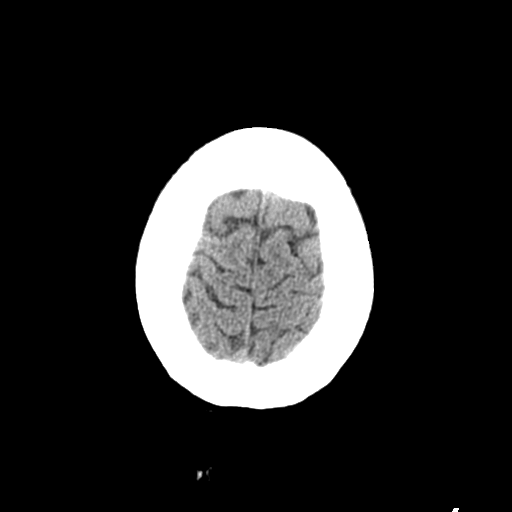
[im 27/29  brain]
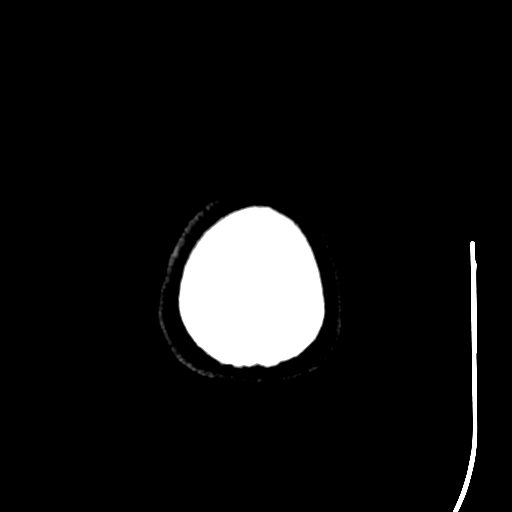
[im 27/29  bone]
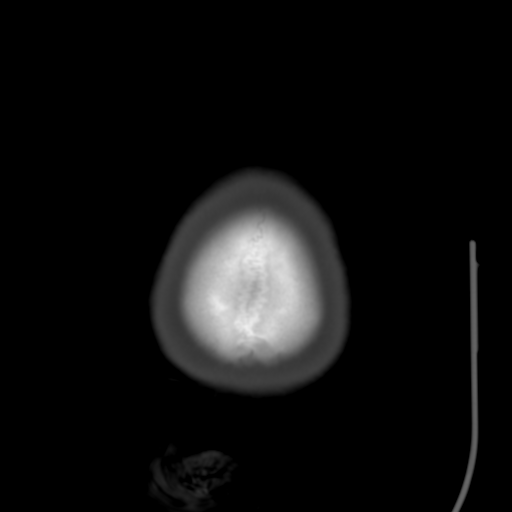

[Series 4: coronal soft tissue · coronal · 0.30mm/px · 3 of 68 slices shown]
[im 23/68  brain]
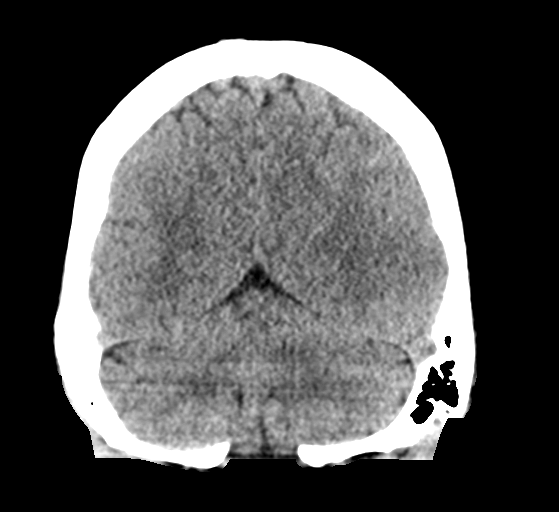
[im 30/68  brain]
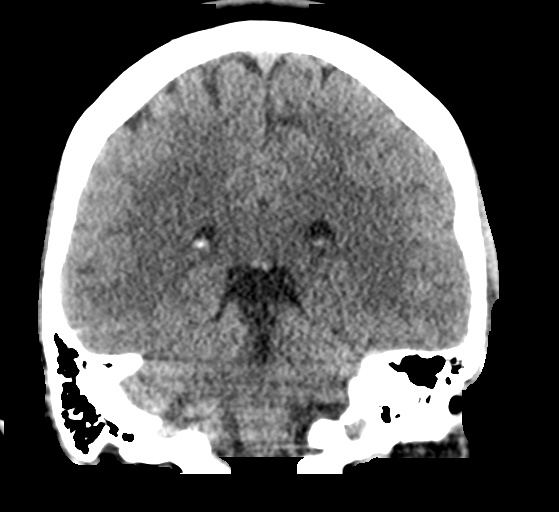
[im 38/68  brain]
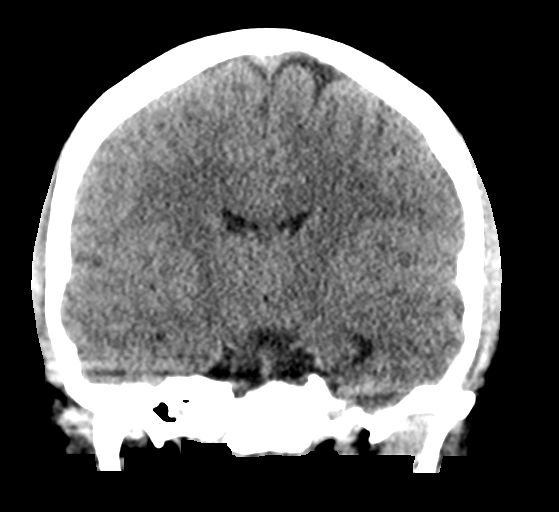

[Series 5: sagittal soft tissue · sagittal · 0.28mm/px · 3 of 54 slices shown]
[im 18/54  brain]
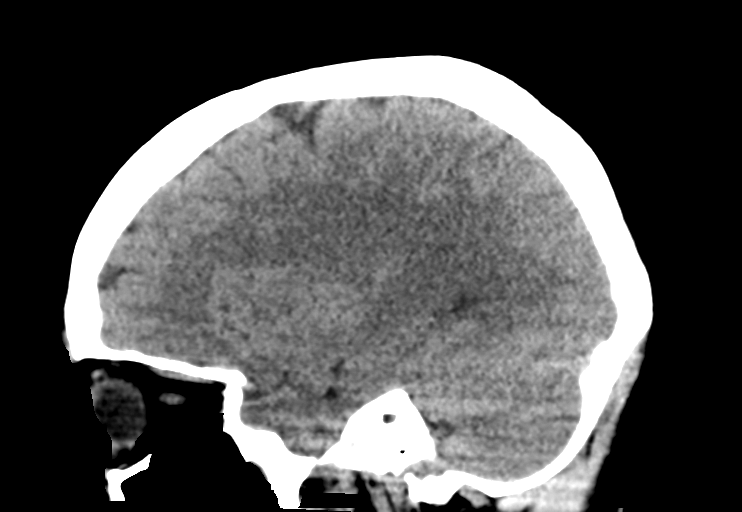
[im 27/54  brain]
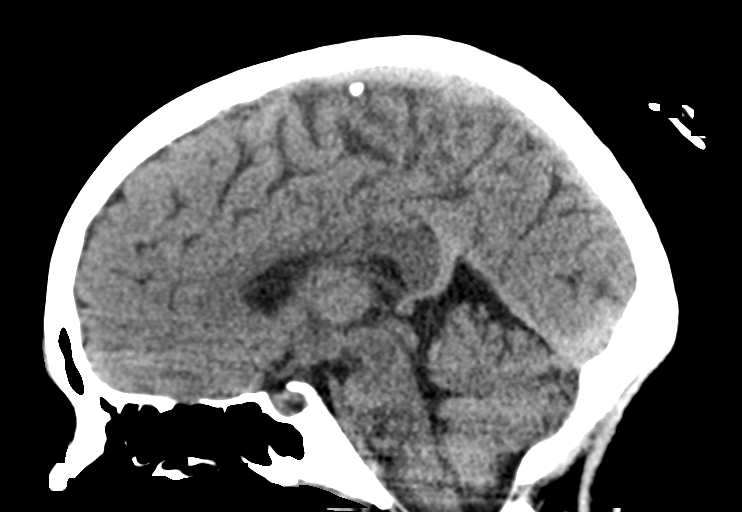
[im 36/54  brain]
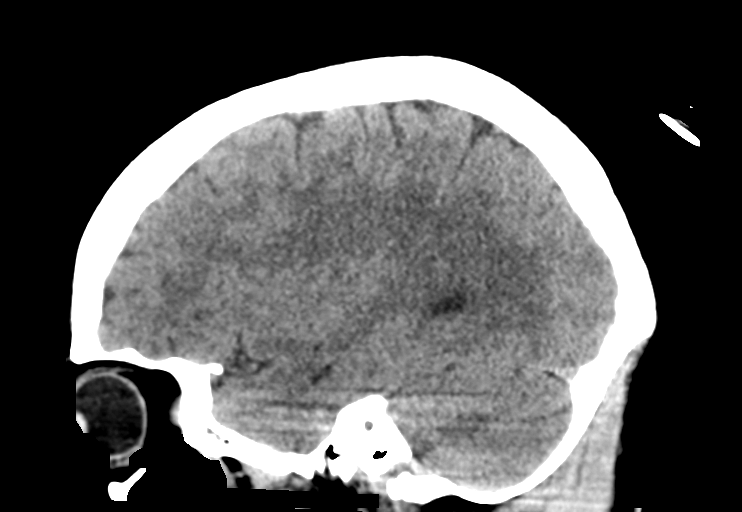

[15 of 46 positions shown; findings below may reference images not displayed]

FINDINGS: Brain: No evidence of acute infarction, hemorrhage, hydrocephalus,
extra-axial collection or mass lesion/mass effect.

Vascular: No hyperdense vessel or unexpected calcification.

Skull: Normal. Negative for fracture or focal lesion.

Sinuses/Orbits: No acute finding.

Other: None.
IMPRESSION: No cause for the patient's seizure/symptoms identified.

## 2020-02-29 DIAGNOSIS — H5213 Myopia, bilateral: Secondary | ICD-10-CM | POA: Diagnosis not present

## 2020-09-27 DIAGNOSIS — Z20822 Contact with and (suspected) exposure to covid-19: Secondary | ICD-10-CM | POA: Diagnosis not present

## 2020-10-02 DIAGNOSIS — Z0000 Encounter for general adult medical examination without abnormal findings: Secondary | ICD-10-CM | POA: Diagnosis not present

## 2020-10-02 DIAGNOSIS — R6882 Decreased libido: Secondary | ICD-10-CM | POA: Diagnosis not present

## 2020-10-02 DIAGNOSIS — E7800 Pure hypercholesterolemia, unspecified: Secondary | ICD-10-CM | POA: Diagnosis not present

## 2020-10-02 DIAGNOSIS — Z0184 Encounter for antibody response examination: Secondary | ICD-10-CM | POA: Diagnosis not present

## 2021-02-09 ENCOUNTER — Encounter: Payer: Self-pay | Admitting: Family Medicine

## 2021-02-09 ENCOUNTER — Other Ambulatory Visit: Payer: Self-pay

## 2021-02-09 ENCOUNTER — Ambulatory Visit: Payer: BC Managed Care – PPO | Admitting: Family Medicine

## 2021-02-09 VITALS — BP 120/70 | HR 97 | Wt 126.6 lb

## 2021-02-09 DIAGNOSIS — R1110 Vomiting, unspecified: Secondary | ICD-10-CM

## 2021-02-09 DIAGNOSIS — R634 Abnormal weight loss: Secondary | ICD-10-CM

## 2021-02-09 DIAGNOSIS — Z87898 Personal history of other specified conditions: Secondary | ICD-10-CM

## 2021-02-09 DIAGNOSIS — F4329 Adjustment disorder with other symptoms: Secondary | ICD-10-CM | POA: Diagnosis not present

## 2021-02-09 LAB — COMPREHENSIVE METABOLIC PANEL

## 2021-02-09 LAB — POCT URINALYSIS DIP (PROADVANTAGE DEVICE)
Blood, UA: NEGATIVE
Glucose, UA: NEGATIVE mg/dL
Leukocytes, UA: NEGATIVE
Nitrite, UA: NEGATIVE
Protein Ur, POC: NEGATIVE mg/dL
Specific Gravity, Urine: 1.015
pH, UA: 6 (ref 5.0–8.0)

## 2021-02-09 LAB — TSH

## 2021-02-09 LAB — CBC WITH DIFFERENTIAL/PLATELET
Immature Grans (Abs): 0 10*3/uL (ref 0.0–0.1)
Immature Granulocytes: 0 %
MCV: 88 fL (ref 79–97)
Monocytes Absolute: 0.4 10*3/uL (ref 0.1–0.9)
Monocytes: 8 %
Neutrophils: 51 %
Platelets: 479 10*3/uL — ABNORMAL HIGH (ref 150–450)
RDW: 13.5 % (ref 11.7–15.4)

## 2021-02-09 NOTE — Patient Instructions (Addendum)
Please go to the [facility]- [phone] Address: [address] [place], [place] [address]  You will hear from [facility] to schedule a visit regarding your seizure history.   We will be in touch with your lab results.    You can call to schedule your appointment with the counselor. A few offices are listed below for you to call.   [facility] [place] [phone] Crisis number [phone]   [facility] [address] [place], [place] [address]  Phone: [phone]  [facility]  [address], [place], [place] [address]  Phone: [phone]  [facility] [address] [place], [place] [address] [phone] for appointments   [facility]  [address] [place], [place] [address] [phone]  [facility]  [address]  (across from [facility])  [phone]   [facility] Resident only [address], [place], [place] [address] [phone]

## 2021-02-09 NOTE — Progress Notes (Signed)
Subjective:    Patient ID: Melissa Logan, female    DOB: 05/01/1996, 25 y.o.   MRN: [id number]  HPI Chief Complaint  Patient presents with  . stressed out    Stressed out due to recent break-up.  Break-up 3 weeks ago. Throwing up everyday morning, water or stomach acid, not eating. Pq-9 and GAD abnormal   She is new to the practice and here to establish care.  Previous medical care: 2 years ago at her pediatrician office   States she has been throwing up every morning for the past 3 weeks. States she is going through a "bad breakup". Reports vomiting 2 x in the mornings and diarrhea in the mornings as well. States if she eats she vomits in the afternoon as well. States her girlfriend broke up with her by cheating on her. States she is now with the other person she cheated with and she is having to deal with this. States she is living with her ex due to not having anywhere else to go until August when she can break her lease. Denies any domestic abuse. No SI or HI.     States she has lost 20 lbs in 3-4 weeks.    Reports having a seizure 2-3 years ago. She reports being admitted to the hospital for this and was advised to follow up with a neurologist but she did not. States she is having more seizures at night. States her girlfriend has told her that she "shakes all over".    LMP: currently on her cycle.    Social history: Lives with her ex girlfriend, works at [employer] and one more job.    Reviewed allergies, medications, past medical, surgical, family, and social history.    Review of Systems Pertinent positives and negatives in the history of present illness.     Objective:   Physical Exam BP 120/70   Pulse 97   Wt 126 lb 9.6 oz (57.4 kg)   LMP 02/05/2021   BMI 21.73 kg/m   Alert and oriented and in no distress.  Cardiac exam shows a regular sinus rhythm without murmurs or gallops. Lungs are clear to auscultation. Abdomen is soft, non distended, non  tender, normal BS.  Skin is warm and dry. Mood is tearful. Denies SI or HI.       Assessment & Plan:  Unintentional weight loss - Plan: CBC with Differential/Platelet, Comprehensive metabolic panel, POCT Urinalysis DIP (Proadvantage Device), TSH, T4, free -Suspect this is related to significant stress, anxiety and depression related to recent break-up.  Encouraged her to stay hydrated and to start eating small frequent meals throughout the day.  Recommend multivitamin.  I will check labs and follow-up.  History of seizure - Plan: Ambulatory referral to Neurology -Documented seizure per ED note in 2019.  She reports seizure-like activity.  Her mother is with her today, arrived at the end of the visit.  I recommend referral to neurology.  Stress and adjustment reaction -Counseling on dealing with stress and anxiety.  Recommend that she stay with a family member or friend for a while instead of her ex.  Discussed that her stress is now affecting her physically.  Encouraged counseling and advised her to go to the [facility] behavioral health urgent care for immediate help.  Directions and information provided.  I will also provide her with a list of counseling offices.  Her mother will be with her. Do not feel that she is any  danger to herself.  Non-intractable vomiting, presence of nausea not specified, unspecified vomiting type - Plan: CBC with Differential/Platelet, Comprehensive metabolic panel -Suspect this is stress and not eating.  Check labs, encouraged hydration and small frequent meals. Follow up with me in 2 weeks or sooner if needed.

## 2021-02-10 LAB — COMPREHENSIVE METABOLIC PANEL
AST: 14 IU/L (ref 0–40)
Albumin/Globulin Ratio: 1.8 (ref 1.2–2.2)
Albumin: 4.7 g/dL (ref 3.9–5.0)
Alkaline Phosphatase: 62 IU/L (ref 44–121)
BUN/Creatinine Ratio: 8 — ABNORMAL LOW (ref 9–23)
Calcium: 10.1 mg/dL (ref 8.7–10.2)
Chloride: 100 mmol/L (ref 96–106)
Creatinine, Ser: 0.86 mg/dL (ref 0.57–1.00)
Globulin, Total: 2.6 g/dL (ref 1.5–4.5)
Glucose: 99 mg/dL (ref 65–99)
Potassium: 3.9 mmol/L (ref 3.5–5.2)
Sodium: 138 mmol/L (ref 134–144)
Total Protein: 7.3 g/dL (ref 6.0–8.5)

## 2021-02-10 LAB — CBC WITH DIFFERENTIAL/PLATELET
Basophils Absolute: 0 10*3/uL (ref 0.0–0.2)
Basos: 1 %
EOS (ABSOLUTE): 0.1 10*3/uL (ref 0.0–0.4)
Eos: 2 %
Hematocrit: 44.1 % (ref 34.0–46.6)
Hemoglobin: 14.6 g/dL (ref 11.1–15.9)
Lymphocytes Absolute: 1.9 10*3/uL (ref 0.7–3.1)
Lymphs: 38 %
MCH: 29.2 pg (ref 26.6–33.0)
MCHC: 33.1 g/dL (ref 31.5–35.7)
Neutrophils Absolute: 2.5 10*3/uL (ref 1.4–7.0)
RBC: 5 x10E6/uL (ref 3.77–5.28)
WBC: 5 10*3/uL (ref 3.4–10.8)

## 2021-02-10 LAB — T4, FREE: Free T4: 1.37 ng/dL (ref 0.82–1.77)

## 2021-02-16 NOTE — Progress Notes (Signed)
Please see if she reviewed her results and my message. It came back to me that she had not seen it. Thanks.

## 2021-02-19 ENCOUNTER — Other Ambulatory Visit: Payer: Self-pay

## 2021-02-19 ENCOUNTER — Encounter: Payer: Self-pay | Admitting: Family Medicine

## 2021-02-19 ENCOUNTER — Ambulatory Visit: Payer: BC Managed Care – PPO | Admitting: Family Medicine

## 2021-02-19 VITALS — BP 110/70 | HR 82 | Temp 98.5°F | Wt 125.6 lb

## 2021-02-19 DIAGNOSIS — N6320 Unspecified lump in the left breast, unspecified quadrant: Secondary | ICD-10-CM | POA: Diagnosis not present

## 2021-02-19 NOTE — Progress Notes (Signed)
   Subjective:    Patient ID: Melissa Logan, female    DOB: 27-Apr-1996, 25 y.o.   MRN: [id number]  HPI Chief Complaint  Patient presents with  . lump on breast    Lump underneath left breast for last 2 days. Just uncomfortable. No pain   Complains of "firm bulge" in her left breast. States she found this 2 days when she was taking off her bra.  Denies history of breast mass or infection.  No family history of breast cancer.   No fever, chills, N/V.   States she has nipple piercing. No sign of infection.   LMP: 02/05/2021  States her mood has improved and she is getting help from a counselor.     Review of Systems Pertinent positives and negatives in the history of present illness.     Objective:   Physical Exam Exam conducted with a chaperone present.  Constitutional:      General: She is not in acute distress.    Appearance: Normal appearance. She is not ill-appearing.  Pulmonary:     Effort: Pulmonary effort is normal.  Chest:  Breasts:     Right: Normal. No skin change, axillary adenopathy or supraclavicular adenopathy.     Left: Mass and tenderness present. No inverted nipple, nipple discharge, skin change, axillary adenopathy or supraclavicular adenopathy.      Comments: 2 cm left breast mass that is TTP, no skin changes or nipple discharge. Piercing in left nipple.  Lymphadenopathy:     Upper Body:     Right upper body: No supraclavicular or axillary adenopathy.     Left upper body: No supraclavicular or axillary adenopathy.  Neurological:     Mental Status: She is alert.    BP 110/70   Pulse 82   Temp 98.5 F (36.9 C)   Wt 125 lb 9.6 oz (57 kg)   LMP 02/05/2021   BMI 21.56 kg/m        Assessment & Plan:  Left breast mass - Plan: US BREAST LTD UNI LEFT INC AXILLA  In her usual state of health.  She has a tender mass in her left breast.  Reassured her that this is most likely benign.  I will send her for breast ultrasound. Her mood has improved  and we discussed this. She is seeing a Veterinary surgeon now.

## 2021-02-22 ENCOUNTER — Institutional Professional Consult (permissible substitution): Payer: Self-pay | Admitting: Family Medicine

## 2021-02-28 ENCOUNTER — Ambulatory Visit: Payer: BC Managed Care – PPO | Admitting: Family Medicine

## 2021-02-28 DIAGNOSIS — N6324 Unspecified lump in the left breast, lower inner quadrant: Secondary | ICD-10-CM | POA: Diagnosis not present

## 2021-03-07 ENCOUNTER — Encounter: Payer: Self-pay | Admitting: Family Medicine

## 2021-03-12 ENCOUNTER — Other Ambulatory Visit: Payer: Medicaid Other

## 2021-03-19 ENCOUNTER — Telehealth: Payer: Self-pay

## 2021-03-19 DIAGNOSIS — N63 Unspecified lump in unspecified breast: Secondary | ICD-10-CM | POA: Diagnosis not present

## 2021-03-19 DIAGNOSIS — N611 Abscess of the breast and nipple: Secondary | ICD-10-CM | POA: Diagnosis not present

## 2021-03-19 MED ORDER — SULFAMETHOXAZOLE-TRIMETHOPRIM 800-160 MG PO TABS: 1.0000 | ORAL_TABLET | Freq: Two times a day (BID) | ORAL | 0 refills | Status: DC

## 2021-03-19 NOTE — Telephone Encounter (Signed)
Sent in med

## 2021-03-19 NOTE — Telephone Encounter (Signed)
Pt. Stated lmp WAS 03/09/21 and there is no chance of pregnancy. She said she uses [facility] and [facility]. If you could send that anti biotic in her for her.

## 2021-03-19 NOTE — Telephone Encounter (Signed)
A tech from [facility] called stating that they saw pt. And she has an abscess on her breast and wanted to know if you could call in some medication for her for the abscess. We referred here there for [place] of breast.

## 2021-03-19 NOTE — Telephone Encounter (Signed)
I am ok to send in an antibiotic and then she will need to follow up with me in 7-10 days. Find out when her LMP was and if there is any chance of pregnancy.

## 2021-03-19 NOTE — Telephone Encounter (Signed)
Pt.notified

## 2021-03-19 NOTE — Telephone Encounter (Signed)
Please send in bactrim if she does not have any allergy for breast abscess. Bactrim DS 800/160 mg twice daily x 10 days. She will need to follow up with me and I also need the records from [place] about the infection. I also talked to Madelaine Bhat getting the records.

## 2021-03-21 ENCOUNTER — Encounter: Payer: Self-pay | Admitting: Family Medicine

## 2021-03-26 DIAGNOSIS — N611 Abscess of the breast and nipple: Secondary | ICD-10-CM | POA: Diagnosis not present

## 2021-03-26 LAB — HM MAMMOGRAPHY

## 2021-03-28 ENCOUNTER — Encounter: Payer: Self-pay | Admitting: Internal Medicine

## 2021-03-29 ENCOUNTER — Encounter: Payer: Self-pay | Admitting: Family Medicine

## 2021-03-29 ENCOUNTER — Encounter: Payer: Self-pay | Admitting: Internal Medicine

## 2021-04-04 NOTE — Patient Instructions (Addendum)
Return for a nurse visit for a Tdap (tetanus, diptheria, pertussis).   Cut back on alcohol use.   Eat a healthy, well balanced diet and try to get at least 150 minutes of physical activity.   We will be in touch with your results.   Preventive Care 23-23 Years Old, Female Preventive care refers to lifestyle choices and visits with your health care provider that can promote health and wellness. This includes:  A yearly physical exam. This is also called an annual wellness visit.  Regular dental and eye exams.  Immunizations.  Screening for certain conditions.  Healthy lifestyle choices, such as: ? Eating a healthy diet. ? Getting regular exercise. ? Not using drugs or products that contain nicotine and tobacco. ? Limiting alcohol use. What can I expect for my preventive care visit? Physical exam Your health care provider may check your:  Height and weight. These may be used to calculate your BMI (body mass index). BMI is a measurement that tells if you are at a healthy weight.  Heart rate and blood pressure.  Body temperature.  Skin for abnormal spots. Counseling Your health care provider may ask you questions about your:  Past medical problems.  Family's medical history.  Alcohol, tobacco, and drug use.  Emotional well-being.  Home life and relationship well-being.  Sexual activity.  Diet, exercise, and sleep habits.  Work and work Statistician.  Access to firearms.  Method of birth control.  Menstrual cycle.  Pregnancy history. What immunizations do I need? Vaccines are usually given at various ages, according to a schedule. Your health care provider will recommend vaccines for you based on your age, medical history, and lifestyle or other factors, such as travel or where you work.   What tests do I need? Blood tests  Lipid and cholesterol levels. These may be checked every 5 years starting at age 23.  Hepatitis C test.  Hepatitis B  test. Screening  Diabetes screening. This is done by checking your blood sugar (glucose) after you have not eaten for a while (fasting).  STD (sexually transmitted disease) testing, if you are at risk.  BRCA-related cancer screening. This may be done if you have a family history of breast, ovarian, tubal, or peritoneal cancers.  Pelvic exam and Pap test. This may be done every 3 years starting at age 23. Starting at age 23, this may be done every 5 years if you have a Pap test in combination with an HPV test. Talk with your health care provider about your test results, treatment options, and if necessary, the need for more tests.   Follow these instructions at home: Eating and drinking  Eat a healthy diet that includes fresh fruits and vegetables, whole grains, lean protein, and low-fat dairy products.  Take vitamin and mineral supplements as recommended by your health care provider.  Do not drink alcohol if: ? Your health care provider tells you not to drink. ? You are pregnant, may be pregnant, or are planning to become pregnant.  If you drink alcohol: ? Limit how much you have to 0-1 drink a day. ? Be aware of how much alcohol is in your drink. In the [place] one drink equals one 12 oz bottle of beer (355 mL), one 5 oz glass of wine (148 mL), or one 1 oz glass of hard liquor (44 mL).   Lifestyle  Take daily care of your teeth and gums. Brush your teeth every morning and night with fluoride toothpaste. Floss one  time each day.  Stay active. Exercise for at least 30 minutes 5 or more days each week.  Do not use any products that contain nicotine or tobacco, such as cigarettes, e-cigarettes, and chewing tobacco. If you need help quitting, ask your health care provider.  Do not use drugs.  If you are sexually active, practice safe sex. Use a condom or other form of protection to prevent STIs (sexually transmitted infections).  If you do not wish to become pregnant, use a form of  birth control. If you plan to become pregnant, see your health care provider for a prepregnancy visit.  Find healthy ways to cope with stress, such as: ? Meditation, yoga, or listening to music. ? Journaling. ? Talking to a trusted person. ? Spending time with friends and family. Safety  Always wear your seat belt while driving or riding in a vehicle.  Do not drive: ? If you have been drinking alcohol. Do not ride with someone who has been drinking. ? When you are tired or distracted. ? While texting.  Wear a helmet and other protective equipment during sports activities.  If you have firearms in your house, make sure you follow all gun safety procedures.  Seek help if you have been physically or sexually abused. What's next?  Go to your health care provider once a year for an annual wellness visit.  Ask your health care provider how often you should have your eyes and teeth checked.  Stay up to date on all vaccines. This information is not intended to replace advice given to you by your health care provider. Make sure you discuss any questions you have with your health care provider. Document Revised: 07/09/2020 Document Reviewed: 07/23/2018 Elsevier Patient Education  2021 [employer].

## 2021-04-04 NOTE — Progress Notes (Signed)
Subjective:    Patient ID: Melissa Logan, female    DOB: 18-Feb-1996, 25 y.o.   MRN: [id number]  HPI Chief Complaint  Patient presents with  . cpe     Fasting cpe with pap, std testing   She is here for a complete physical exam.  Other providers: Neurologist   Recovering from left breast abscess and doing well. Completed antibiotics.   Has upcoming appt with neurologist for possible seizures and hx of seizure.   Thrombocytosis which needs follow-up  States she is still living with her ex which was causing her a great deal of emotional difficulty but is now doing better.  States she has good days and bad days.   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   Immunizations: needs Tdap and declines today   Health maintenance:  Mammogram: [place] recently  Colonoscopy: N/A Last Gynecological Exam: never  Last Menstrual cycle: 03/04/2021 Pregnancies: 0 Last Dental Exam: last year  Last Eye Exam: upcoming appt   Wears seatbelt always, smoke detectors in home and functioning, does not text while driving and feels safe in home environment.   Reviewed allergies, medications, past medical, surgical, family, and social history.   Review of Systems Review of Systems Constitutional: -fever, -chills, -sweats, -unexpected weight change,-fatigue ENT: -runny nose, -ear pain, -sore throat Cardiology:  -chest pain, -palpitations, -edema Respiratory: -cough, -shortness of breath, -wheezing Gastroenterology: -abdominal pain, -nausea, -vomiting, -diarrhea, -constipation  Hematology: -bleeding or bruising problems Musculoskeletal: -arthralgias, -myalgias, -joint swelling, -back pain Ophthalmology: -vision changes Urology: -dysuria, -difficulty urinating, -hematuria,  -urinary frequency, -urgency Neurology: -headache, -weakness, -tingling, -numbness       Objective:   Physical Exam BP 110/78   Pulse 68   Ht 5\' 5"  (1.651 m)   Wt 123 lb (55.8 kg)   LMP 03/04/2021   BMI 20.47 kg/m   General Appearance:    Alert, cooperative, no distress, appears stated age  Head:    Normocephalic, without obvious abnormality, atraumatic  Eyes:    PERRL, conjunctiva/corneas clear, EOM's intact  Ears:    Normal TM's and external ear canals  Nose:   Mask on   Throat:   Mask on   Neck:   Supple, no lymphadenopathy;  thyroid:  no   enlargement/tenderness/nodules; no JVD  Back:    Spine nontender, no curvature, ROM normal, no CVA     tenderness  Lungs:     Clear to auscultation bilaterally without wheezes, rales or     ronchi; respirations unlabored  Chest Wall:    No tenderness or deformity   Heart:    Regular rate and rhythm, S1 and S2 normal, no murmur, rub   or gallop  Breast Exam:    Declines. Recent 05/04/2021  Abdomen:     Soft, non-tender, nondistended, normoactive bowel sounds,    no masses, no hepatosplenomegaly  Genitalia:    Normal external genitalia without lesions.  BUS and vagina normal; cervix without lesions, or cervical motion tenderness. No abnormal vaginal discharge.  Uterus and adnexa not enlarged, nontender, no masses.  Pap performed, chaperone present.   Rectal:    Not performed due to age<40 and no related complaints  Extremities:   No clubbing, cyanosis or edema  Pulses:   2+ and  symmetric all extremities  Skin:   Skin color, texture, turgor normal, no rashes or lesions  Lymph nodes:   Cervical, supraclavicular, and axillary nodes normal  Neurologic:   CNII-XII intact, normal strength, sensation and gait          Psych:   Normal mood, affect, hygiene and grooming.         Assessment & Plan:  Routine general medical examination at a health care facility - Plan: CBC with Differential/Platelet, Comprehensive metabolic panel, Lipid panel -Preventive  health care reviewed.  Her first Pap smear was done today.  Recent breast ultrasound and declines breast exam today.  Counseling on healthy lifestyle including diet and exercise.  Recommend she cut back on nightly alcohol use.  Recommend regular dental and eye exams.  Discussed safety and health promotion.  Immunizations also reviewed.  Screening for cervical cancer - Plan: Cytology - PAP([facility]) -This was her first Pap smear.  There was some degree of difficulty due to her inability to relax her muscles.  Could not completely visualize her cervix but cervical os partially in view, did attempt Pap smear.  Chaperone present.  Screen for STD (sexually transmitted disease) - Plan: HIV Antibody (routine testing w rflx), RPR, Hepatitis C antibody, Cytology - PAP([facility]) -Counseling on safe sex even with same-sex partner.  Screen for STDs per patient request  Need for hepatitis C screening test - Plan: Hepatitis C antibody -Done per screening guidelines  Immunization counseling -She may return for a Tdap at her convenience.  She declines this today.  Stress and adjustment reaction -She seems to be doing better overall.  She will let me know if she is worsening.  I do recommend she cut back on alcohol and see a counselor.  Thrombocytosis - Plan: CBC with Differential/Platelet, Comprehensive metabolic panel, Iron, TIBC and Ferritin Panel -We will check labs and follow-up.  She is aware that I may refer her to hematology if platelet count has not improved.  Screening for lipid disorders - Plan: Lipid panel -Follow-up pending results

## 2021-04-05 ENCOUNTER — Encounter: Payer: Self-pay | Admitting: Family Medicine

## 2021-04-05 ENCOUNTER — Ambulatory Visit (INDEPENDENT_AMBULATORY_CARE_PROVIDER_SITE_OTHER): Payer: BC Managed Care – PPO | Admitting: Family Medicine

## 2021-04-05 ENCOUNTER — Other Ambulatory Visit (HOSPITAL_COMMUNITY)
Admission: RE | Admit: 2021-04-05 | Discharge: 2021-04-05 | Disposition: A | Discharge status: Home / Self Care | Payer: Medicaid Other | Source: Ambulatory Visit | Attending: Family Medicine | Admitting: Family Medicine

## 2021-04-05 ENCOUNTER — Other Ambulatory Visit: Payer: Self-pay

## 2021-04-05 VITALS — BP 110/78 | HR 68 | Ht 65.0 in | Wt 123.0 lb

## 2021-04-05 DIAGNOSIS — Z113 Encounter for screening for infections with a predominantly sexual mode of transmission: Secondary | ICD-10-CM | POA: Insufficient documentation

## 2021-04-05 DIAGNOSIS — D75839 Thrombocytosis, unspecified: Secondary | ICD-10-CM

## 2021-04-05 DIAGNOSIS — Z13220 Encounter for screening for lipoid disorders: Secondary | ICD-10-CM

## 2021-04-05 DIAGNOSIS — Z0000 Encounter for general adult medical examination without abnormal findings: Secondary | ICD-10-CM

## 2021-04-05 DIAGNOSIS — F4329 Adjustment disorder with other symptoms: Secondary | ICD-10-CM

## 2021-04-05 DIAGNOSIS — Z124 Encounter for screening for malignant neoplasm of cervix: Secondary | ICD-10-CM | POA: Insufficient documentation

## 2021-04-05 DIAGNOSIS — Z1159 Encounter for screening for other viral diseases: Secondary | ICD-10-CM

## 2021-04-05 DIAGNOSIS — Z7185 Encounter for immunization safety counseling: Secondary | ICD-10-CM

## 2021-04-06 LAB — HIV ANTIBODY (ROUTINE TESTING W REFLEX): HIV Screen 4th Generation wRfx: NONREACTIVE

## 2021-04-06 LAB — CBC WITH DIFFERENTIAL/PLATELET
Immature Granulocytes: 0 %
Neutrophils: 52 %

## 2021-04-06 LAB — COMPREHENSIVE METABOLIC PANEL
Albumin/Globulin Ratio: 1.8 (ref 1.2–2.2)
Chloride: 103 mmol/L (ref 96–106)
Globulin, Total: 2.8 g/dL (ref 1.5–4.5)

## 2021-04-06 LAB — IRON,TIBC AND FERRITIN PANEL
Iron Saturation: 39 % (ref 15–55)
Iron: 166 ug/dL — ABNORMAL HIGH (ref 27–159)

## 2021-04-06 LAB — LIPID PANEL: Chol/HDL Ratio: 3.6 ratio (ref 0.0–4.4)

## 2021-04-10 LAB — COMPREHENSIVE METABOLIC PANEL
ALT: 24 IU/L (ref 0–32)
AST: 28 IU/L (ref 0–40)
Albumin: 5 g/dL (ref 3.9–5.0)
Alkaline Phosphatase: 66 IU/L (ref 44–121)
BUN/Creatinine Ratio: 13 (ref 9–23)
BUN: 8 mg/dL (ref 6–20)
Bilirubin Total: 1.4 mg/dL — ABNORMAL HIGH (ref 0.0–1.2)
CO2: 18 mmol/L — ABNORMAL LOW (ref 20–29)
Calcium: 10.1 mg/dL (ref 8.7–10.2)
Creatinine, Ser: 0.63 mg/dL (ref 0.57–1.00)
Glucose: 83 mg/dL (ref 65–99)
Potassium: 4.7 mmol/L (ref 3.5–5.2)
Sodium: 141 mmol/L (ref 134–144)
Total Protein: 7.8 g/dL (ref 6.0–8.5)
eGFR: 129 mL/min/{1.73_m2} (ref >59)

## 2021-04-10 LAB — CBC WITH DIFFERENTIAL/PLATELET
Basophils Absolute: 0 10*3/uL (ref 0.0–0.2)
Basos: 1 %
EOS (ABSOLUTE): 0.1 10*3/uL (ref 0.0–0.4)
Eos: 2 %
Hematocrit: 44.7 % (ref 34.0–46.6)
Hemoglobin: 14.6 g/dL (ref 11.1–15.9)
Immature Grans (Abs): 0 10*3/uL (ref 0.0–0.1)
Lymphocytes Absolute: 2.6 10*3/uL (ref 0.7–3.1)
Lymphs: 38 %
MCH: 29.4 pg (ref 26.6–33.0)
MCHC: 32.7 g/dL (ref 31.5–35.7)
MCV: 90 fL (ref 79–97)
Monocytes Absolute: 0.4 10*3/uL (ref 0.1–0.9)
Monocytes: 7 %
Neutrophils Absolute: 3.6 10*3/uL (ref 1.4–7.0)
Platelets: 424 10*3/uL (ref 150–450)
RBC: 4.97 x10E6/uL (ref 3.77–5.28)
RDW: 13.8 % (ref 11.7–15.4)
WBC: 6.8 10*3/uL (ref 3.4–10.8)

## 2021-04-10 LAB — IRON,TIBC AND FERRITIN PANEL
Ferritin: 63 ng/mL (ref 15–150)
Total Iron Binding Capacity: 422 ug/dL (ref 250–450)
UIBC: 256 ug/dL (ref 131–425)

## 2021-04-10 LAB — LIPID PANEL
Cholesterol, Total: 205 mg/dL — ABNORMAL HIGH (ref 100–199)
HDL: 57 mg/dL (ref >39)
LDL Chol Calc (NIH): 125 mg/dL — ABNORMAL HIGH (ref 0–99)
Triglycerides: 130 mg/dL (ref 0–149)
VLDL Cholesterol Cal: 23 mg/dL (ref 5–40)

## 2021-04-10 LAB — RPR: RPR Ser Ql: NONREACTIVE

## 2021-04-10 LAB — CYTOLOGY - PAP: Diagnosis: NEGATIVE

## 2021-04-10 LAB — HEPATITIS C ANTIBODY: Hep C Virus Ab: <0.1 s/co ratio (ref 0.0–0.9)

## 2021-05-10 ENCOUNTER — Encounter: Payer: Self-pay | Admitting: Neurology

## 2021-05-10 ENCOUNTER — Telehealth: Payer: Self-pay | Admitting: *Deleted

## 2021-05-10 ENCOUNTER — Ambulatory Visit: Payer: BC Managed Care – PPO | Admitting: Neurology

## 2021-05-10 NOTE — Telephone Encounter (Signed)
No showed new patient appointment. 

## 2022-03-20 DIAGNOSIS — J00 Acute nasopharyngitis [common cold]: Secondary | ICD-10-CM | POA: Diagnosis not present

## 2022-03-20 DIAGNOSIS — Z20822 Contact with and (suspected) exposure to covid-19: Secondary | ICD-10-CM | POA: Diagnosis not present

## 2022-03-20 DIAGNOSIS — Z03818 Encounter for observation for suspected exposure to other biological agents ruled out: Secondary | ICD-10-CM | POA: Diagnosis not present

## 2022-04-08 ENCOUNTER — Ambulatory Visit: Payer: BC Managed Care – PPO | Admitting: Physician Assistant

## 2022-04-08 ENCOUNTER — Encounter: Payer: Self-pay | Admitting: Physician Assistant

## 2022-04-08 VITALS — BP 112/70 | HR 82 | Temp 98.6°F | Ht 64.5 in | Wt 113.8 lb

## 2022-04-08 DIAGNOSIS — E785 Hyperlipidemia, unspecified: Secondary | ICD-10-CM | POA: Insufficient documentation

## 2022-04-08 DIAGNOSIS — Z113 Encounter for screening for infections with a predominantly sexual mode of transmission: Secondary | ICD-10-CM

## 2022-04-08 DIAGNOSIS — Z0000 Encounter for general adult medical examination without abnormal findings: Secondary | ICD-10-CM | POA: Diagnosis not present

## 2022-04-08 DIAGNOSIS — Z114 Encounter for screening for human immunodeficiency virus [HIV]: Secondary | ICD-10-CM | POA: Diagnosis not present

## 2022-04-08 DIAGNOSIS — L309 Dermatitis, unspecified: Secondary | ICD-10-CM

## 2022-04-08 DIAGNOSIS — Z681 Body mass index (BMI) 19 or less, adult: Secondary | ICD-10-CM

## 2022-04-08 LAB — CBC WITH DIFFERENTIAL/PLATELET
Basophils Absolute: 0 10*3/uL (ref 0.0–0.2)
Basos: 1 %
EOS (ABSOLUTE): 0.2 10*3/uL (ref 0.0–0.4)
Eos: 3 %
Hematocrit: 43.4 % (ref 34.0–46.6)
Hemoglobin: 14.5 g/dL (ref 11.1–15.9)
Immature Grans (Abs): 0 10*3/uL (ref 0.0–0.1)
Immature Granulocytes: 1 %
Lymphocytes Absolute: 2.4 10*3/uL (ref 0.7–3.1)
Lymphs: 42 %
MCH: 29.6 pg (ref 26.6–33.0)
MCHC: 33.4 g/dL (ref 31.5–35.7)
MCV: 89 fL (ref 79–97)
Monocytes Absolute: 0.4 10*3/uL (ref 0.1–0.9)
Monocytes: 8 %
Neutrophils Absolute: 2.7 10*3/uL (ref 1.4–7.0)
Neutrophils: 45 %
Platelets: 412 10*3/uL (ref 150–450)
RBC: 4.9 x10E6/uL (ref 3.77–5.28)
RDW: 12.8 % (ref 11.7–15.4)
WBC: 5.8 10*3/uL (ref 3.4–10.8)

## 2022-04-08 LAB — COMPREHENSIVE METABOLIC PANEL
ALT: 15 IU/L (ref 0–32)
AST: 16 IU/L (ref 0–40)
Albumin/Globulin Ratio: 1.7 (ref 1.2–2.2)
Albumin: 4.6 g/dL (ref 3.9–5.0)
Alkaline Phosphatase: 56 IU/L (ref 44–121)
BUN/Creatinine Ratio: 15 (ref 9–23)
BUN: 11 mg/dL (ref 6–20)
Bilirubin Total: 1.1 mg/dL (ref 0.0–1.2)
CO2: 21 mmol/L (ref 20–29)
Calcium: 9.7 mg/dL (ref 8.7–10.2)
Chloride: 104 mmol/L (ref 96–106)
Creatinine, Ser: 0.74 mg/dL (ref 0.57–1.00)
Globulin, Total: 2.7 g/dL (ref 1.5–4.5)
Glucose: 82 mg/dL (ref 70–99)
Potassium: 4 mmol/L (ref 3.5–5.2)
Sodium: 138 mmol/L (ref 134–144)
Total Protein: 7.3 g/dL (ref 6.0–8.5)
eGFR: 117 mL/min/{1.73_m2} (ref >59)

## 2022-04-08 LAB — LIPID PANEL
Chol/HDL Ratio: 2.6 ratio (ref 0.0–4.4)
Cholesterol, Total: 201 mg/dL — ABNORMAL HIGH (ref 100–199)
HDL: 76 mg/dL (ref >39)
LDL Chol Calc (NIH): 108 mg/dL — ABNORMAL HIGH (ref 0–99)
Triglycerides: 97 mg/dL (ref 0–149)
VLDL Cholesterol Cal: 17 mg/dL (ref 5–40)

## 2022-04-08 MED ORDER — TRIAMCINOLONE ACETONIDE 0.1 % EX CREA: 1.0000 "application " | TOPICAL_CREAM | Freq: Two times a day (BID) | CUTANEOUS | 1 refills | Status: DC

## 2022-04-08 NOTE — Patient Instructions (Signed)

## 2022-04-08 NOTE — Assessment & Plan Note (Signed)
Stable, patient requests refill of triamcinolone with eucerin ?

## 2022-04-08 NOTE — Progress Notes (Signed)
? ?Complete physical exam ? ? ?Patient: Melissa Logan   DOB: 04/24/1996   26 y.o. Female  MRN: [id number] ?Visit Date: 04/08/2022 ? ?Chief Complaint  ?Patient presents with  ? Annual Exam  ?  CPE fasting labs, pap, eczema flaring up made need script for that. Goes to dentist and eye doctor yearly.   ? ?Subjective  ?  ?Melissa Logan is a 26 y.o. female who presents today for a complete physical exam.  ? ?Reports is generally feeling well; is eating a healthy diet; is sleeping well 7 hours at night; drinks 5 bottles of water a day; is exercising by walking dog 5 days a week ? ? ?HPI ?HPI   ? ? Annual Exam   ? Additional comments: CPE fasting labs, pap, eczema flaring up made need script for that. Goes to dentist and eye doctor yearly.  ? ?  ?  ?Last edited by Rhona Leavens, RMA on 04/08/2022  9:50 AM.  ?  ?  ? ? ?Past Medical History:  ?Diagnosis Date  ? Allergy   ? Chronic low back pain   ? Eczema   ? Scoliosis   ? ?History reviewed. No pertinent surgical history. ?Social History  ? ?Socioeconomic History  ? Marital status: Single  ?  Spouse name: Not on file  ? Number of children: Not on file  ? Years of education: Not on file  ? Highest education level: Not on file  ?Occupational History  ? Not on file  ?Tobacco Use  ? Smoking status: Never  ? Smokeless tobacco: Never  ?Vaping Use  ? Vaping Use: Not on file  ?Substance and Sexual Activity  ? Alcohol use: Yes  ?  Alcohol/week: 0.0 standard drinks  ?  Comment: nightly   ? Drug use: Not Currently  ? Sexual activity: Not Currently  ?  Partners: Female  ?  Birth control/protection: Condom  ?Other Topics Concern  ? Not on file  ?Social History Narrative  ? Lives with parents and sibling, no smokers in the house '  ?   ?   ? Graduated HS in 2017   ?   ? Works at [employer]   ?   ?   ? ?Social Determinants of Health  ? ?Financial Resource Strain: Not on file  ?Food Insecurity: Not on file  ?Transportation Needs: Not on file  ?Physical Activity: Not on file  ?Stress: Not  on file  ?Social Connections: Not on file  ?Intimate Partner Violence: Not on file  ? ?Family Status  ?Relation Name Status  ? Mother  Alive  ? Father  Alive  ? Sister  Alive  ? MGM  (Not Specified)  ? Cousin  (Not Specified)  ? Neg Hx  (Not Specified)  ? ?Family History  ?Problem Relation Age of Onset  ? Healthy Mother   ? Hyperlipidemia Mother   ? Healthy Father   ? Healthy Sister   ? Hyperlipidemia Maternal Grandmother   ? Hypertension Maternal Grandmother   ? Muscular dystrophy Cousin   ? Diabetes type II Neg Hx   ? Heart attack Neg Hx   ? CVA Neg Hx   ? Breast cancer Neg Hx   ? Colon cancer Neg Hx   ? ?Allergies  ?Allergen Reactions  ? Nickel   ?  ?Patient Care Team: ?Marcellina Millin as PCP - General (Physician Assistant)  ? ?Medications: ?No outpatient medications prior to visit.  ? ?No facility-administered medications prior to  visit.  ? ? ?Review of Systems  ?Constitutional:  Negative for activity change and fever.  ?HENT:  Negative for congestion, ear pain and voice change.   ?Eyes:  Negative for redness.  ?Respiratory:  Negative for cough.   ?Cardiovascular:  Negative for chest pain.  ?Gastrointestinal:  Negative for constipation and diarrhea.  ?Endocrine: Negative for polyuria.  ?Genitourinary:  Negative for flank pain.  ?Musculoskeletal:  Negative for gait problem and neck stiffness.  ?Skin:  Negative for color change and rash.  ?Neurological:  Negative for dizziness.  ?Hematological:  Negative for adenopathy.  ?Psychiatric/Behavioral:  Negative for agitation, behavioral problems and confusion.   ? ?Last CBC ?Lab Results  ?Component Value Date  ? WBC 6.8 04/05/2021  ? HGB 14.6 04/05/2021  ? HCT 44.7 04/05/2021  ? MCV 90 04/05/2021  ? MCH 29.4 04/05/2021  ? RDW 13.8 04/05/2021  ? PLT 424 04/05/2021  ? ?Last metabolic panel ?Lab Results  ?Component Value Date  ? GLUCOSE 83 04/05/2021  ? NA 141 04/05/2021  ? K 4.7 04/05/2021  ? CL 103 04/05/2021  ? CO2 18 (L) 04/05/2021  ? BUN 8 04/05/2021  ?  CREATININE 0.63 04/05/2021  ? EGFR 129 04/05/2021  ? CALCIUM 10.1 04/05/2021  ? PROT 7.8 04/05/2021  ? ALBUMIN 5.0 04/05/2021  ? LABGLOB 2.8 04/05/2021  ? AGRATIO 1.8 04/05/2021  ? BILITOT 1.4 (H) 04/05/2021  ? ALKPHOS 66 04/05/2021  ? AST 28 04/05/2021  ? ALT 24 04/05/2021  ? ANIONGAP 11 09/20/2018  ? ?Last lipids ?Lab Results  ?Component Value Date  ? CHOL 205 (H) 04/05/2021  ? HDL 57 04/05/2021  ? LDLCALC 125 (H) 04/05/2021  ? TRIG 130 04/05/2021  ? CHOLHDL 3.6 04/05/2021  ? ?Last hemoglobin A1c ?No results found for: HGBA1C ?  ? ?The ASCVD Risk score (Arnett DK, et al., 2019) failed to calculate for the following reasons: ?  The 2019 ASCVD risk score is only valid for ages 11 to 21 ? ? Objective  ?  ?BP 112/70   Pulse 82   Temp 98.6 ?F (37 ?C)   Ht 5' 4.5" (1.638 m)   Wt 113 lb 12.8 oz (51.6 kg)   LMP 03/27/2022   BMI 19.23 kg/m?  ? ?  ? ? ?Physical Exam ?Vitals and nursing note reviewed.  ?Constitutional:   ?   General: She is not in acute distress. ?   Appearance: Normal appearance. She is not ill-appearing.  ?HENT:  ?   Head: Normocephalic and atraumatic.  ?   Right Ear: Tympanic membrane, ear canal and external ear normal.  ?   Left Ear: Tympanic membrane, ear canal and external ear normal.  ?   Nose: No congestion.  ?Eyes:  ?   Extraocular Movements: Extraocular movements intact.  ?   Conjunctiva/sclera: Conjunctivae normal.  ?   Pupils: Pupils are equal, round, and reactive to light.  ?Neck:  ?   Vascular: No carotid bruit.  ?Cardiovascular:  ?   Rate and Rhythm: Normal rate and regular rhythm.  ?   Pulses: Normal pulses.  ?   Heart sounds: Normal heart sounds.  ?Pulmonary:  ?   Effort: Pulmonary effort is normal.  ?   Breath sounds: Normal breath sounds. No wheezing.  ?Abdominal:  ?   General: Bowel sounds are normal.  ?   Palpations: Abdomen is soft.  ?Musculoskeletal:     ?   General: Normal range of motion.  ?   Cervical back: Normal range  of motion and neck supple.  ?   Right lower leg: No  edema.  ?   Left lower leg: No edema.  ?Skin: ?   General: Skin is warm and dry.  ?   Findings: No bruising.  ?Neurological:  ?   General: No focal deficit present.  ?   Mental Status: She is alert and oriented to person, place, and time.  ?Psychiatric:     ?   Mood and Affect: Mood normal.     ?   Behavior: Behavior normal.     ?   Thought Content: Thought content normal.  ?  ? ? ?Last depression screening scores ? ?  04/08/2022  ?  9:49 AM 04/05/2021  ? 10:59 AM 02/09/2021  ?  2:33 PM  ?PHQ 2/9 Scores  ?PHQ - 2 Score 0 1 3  ?PHQ- 9 Score  1 12  ? ?Last fall risk screening ? ?  04/08/2022  ?  9:49 AM  ?Fall Risk   ?Falls in the past year? 0  ?Number falls in past yr: 0  ?Injury with Fall? 0  ?Risk for fall due to : No Fall Risks  ?Follow up Falls evaluation completed  ? ? ? ?No results found for any visits on 04/08/22. ? Assessment & Plan  ?  ?Routine Health Maintenance and Physical Exam ? ?Exercise Activities and Dietary recommendations ? Goals   ?None ?  ? ? ?Immunization History  ?Administered Date(s) Administered  ? DTaP 10/13/1998, 12/15/1998, 03/05/1999, 12/21/1999, 08/30/2002  ? HPV Quadrivalent 06/25/2012, 09/17/2012, 07/04/2014  ? Hepatitis A 12/11/2007, 12/12/2008  ? Hepatitis B 03/05/1999, 09/10/1999, 12/21/1999  ? HiB (PRP-OMP) 10/13/1998, 12/15/1998, 12/21/1999  ? IPV 10/13/1998, 12/15/1998, 08/31/1999, 08/30/2002  ? Influenza-Unspecified 10/05/2010, 09/17/2012  ? MMR 08/31/1999, 08/30/2002  ? Meningococcal Conjugate 10/05/2010, 06/19/2015  ? Pneumococcal Conjugate-13 04/30/2000, 08/06/2000  ? Tdap 07/14/2009  ? Varicella 08/31/1999, 12/11/2007  ? ? ?Health Maintenance  ?Topic Date Due  ? TETANUS/TDAP  11/11/2022 (Originally 07/15/2019)  ? INFLUENZA VACCINE  06/25/2022  ? PAP-Cervical Cytology Screening  04/05/2024  ? HPV VACCINES  Completed  ? Hepatitis C Screening  Completed  ? HIV Screening  Completed  ? ? ?Discussed health benefits of physical activity, and encouraged her to engage in regular exercise  appropriate for her age and condition. ? ?Problem List Items Addressed This Visit   ? ?  ? Musculoskeletal and Integument  ? Eczema  ?  Stable, patient requests refill of triamcinolone with eucerin ? ?  ?  ?  ? Other

## 2022-04-08 NOTE — Assessment & Plan Note (Signed)
Stable, will monitor 

## 2022-04-08 NOTE — Assessment & Plan Note (Signed)
controlled, lipid panel checked today, eat a low fat diet, increase fiber intake (Benefiber or Metamucil, Cherrios,  oatmeal, beans, nuts, fruits and vegetables), limit saturated fats (in fried foods, red meat), can add OTC fish oil supplement, eat fish with Omega-3 fatty acids like salmon and tuna, exercise for 30 minutes 3 - 5 times a week, drink 8 - 10 glasses of water a day ? ? ?

## 2022-04-11 ENCOUNTER — Other Ambulatory Visit: Payer: BC Managed Care – PPO

## 2022-04-11 DIAGNOSIS — Z0000 Encounter for general adult medical examination without abnormal findings: Secondary | ICD-10-CM | POA: Diagnosis not present

## 2022-04-11 DIAGNOSIS — Z113 Encounter for screening for infections with a predominantly sexual mode of transmission: Secondary | ICD-10-CM | POA: Diagnosis not present

## 2022-04-14 LAB — RPR+HIV+GC+CT PANEL
Chlamydia trachomatis, NAA: NEGATIVE
HIV Screen 4th Generation wRfx: NONREACTIVE
Neisseria Gonorrhoeae by PCR: NEGATIVE
RPR Ser Ql: NONREACTIVE

## 2022-04-24 ENCOUNTER — Other Ambulatory Visit: Payer: Self-pay | Admitting: Physician Assistant

## 2022-04-24 ENCOUNTER — Telehealth: Payer: Self-pay | Admitting: Physician Assistant

## 2022-04-24 MED ORDER — TRIAMCINOLONE ACETONIDE 0.1 % EX CREA: 1.0000 "application " | TOPICAL_CREAM | Freq: Two times a day (BID) | CUTANEOUS | 1 refills | Status: DC

## 2022-04-24 NOTE — Telephone Encounter (Signed)
Triamcinolone cream prescribed to [place] [facility]. Thanks.

## 2022-04-24 NOTE — Telephone Encounter (Signed)
Melissa Logan went to pick up her prescription for triamcinolone and they were out. She asks if we can change it to [facility] on [address].

## 2022-05-24 ENCOUNTER — Encounter: Payer: Self-pay | Admitting: Internal Medicine

## 2022-07-31 ENCOUNTER — Encounter: Payer: Self-pay | Admitting: Internal Medicine

## 2022-10-08 ENCOUNTER — Encounter: Payer: Self-pay | Admitting: Internal Medicine

## 2023-04-10 ENCOUNTER — Encounter: Payer: BC Managed Care – PPO | Admitting: Physician Assistant

## 2023-06-03 ENCOUNTER — Encounter: Payer: Self-pay | Admitting: Nurse Practitioner

## 2023-06-03 ENCOUNTER — Ambulatory Visit: Payer: BC Managed Care – PPO | Admitting: Nurse Practitioner

## 2023-06-03 VITALS — BP 120/74 | HR 102 | Wt 124.4 lb

## 2023-06-03 DIAGNOSIS — N61 Mastitis without abscess: Secondary | ICD-10-CM

## 2023-06-03 MED ORDER — IBUPROFEN 800 MG PO TABS: 800.0000 mg | ORAL_TABLET | Freq: Three times a day (TID) | ORAL | 0 refills | Status: AC | PRN

## 2023-06-03 MED ORDER — CEPHALEXIN 500 MG PO CAPS: 500.0000 mg | ORAL_CAPSULE | Freq: Three times a day (TID) | ORAL | 0 refills | Status: DC

## 2023-06-03 NOTE — Progress Notes (Signed)
Tollie Eth, DNP, AGNP-c [facility] [address] [place], [place] [address] [phone]   ACUTE VISIT- ESTABLISHED PATIENT  Blood pressure 120/74, pulse (!) 102, weight 124 lb 6.4 oz (56.4 kg).  Subjective:  HPI Melissa Logan is a 27 y.o. female presents to day for evaluation of: breast lump  Melissa Logan presents today with a chief complaint of a lump on her left breast, which she noticed approximately one month ago. She reports that the lump became more painful after working out this past weekend and is warm to the touch. The pain has since decreased, but the area remains red and was previously reddish-purple in color. The patient has a history of a similar lump in 2022, which required aspiration.   She denies any drainage from the lump but mentions having piercings in the area, which have had some leakage. The lump is described as tender, but she states it does not cause significant pain. The patient inquired about the possibility of the piercing causing the issue, but was informed by the piercer that it could happen to anyone, even without piercings.  She has no known allergies except for nickel and denies experiencing yeast infections when taking antibiotics. 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 She denies any fever, chills, or nausea.   PMH, Medications, and Allergies reviewed and updated in chart as appropriate.   ROS negative except for what is listed in HPI. Objective:  Physical Exam Vitals and nursing note reviewed.  Constitutional:      General: She is not in acute distress.    Appearance: Normal appearance. She is not ill-appearing.  Eyes:     Conjunctiva/sclera: Conjunctivae normal.  Cardiovascular:     Rate and Rhythm: Normal rate and regular rhythm.     Pulses: Normal pulses.     Heart sounds: Normal heart sounds.  Pulmonary:     Effort: Pulmonary effort is normal.     Breath sounds: Normal breath  sounds.  Chest:    Abdominal:     General: Bowel sounds are normal.     Palpations: Abdomen is soft.  Skin:    General: Skin is warm and dry.     Capillary Refill: Capillary refill takes less than 2 seconds.  Neurological:     Mental Status: She is alert and oriented to person, place, and time.         Assessment & Plan:   Problem List Items Addressed This Visit     Cellulitis of left breast - Primary    The patient presents with a tender, erythematous lump in the left breast near the areola on the 12  oclock position. She reports a history of a similar lump in 2022 that required aspiration. No fever, chills, or nausea are present. The patient has breast piercings with some leakage reported with no drainage present at this time. Area is warm, but soft.   Plan: - Prescribe Keflex for possible bacterial infection with MRSA coverage - Monitor for improvement by Thursday or Friday; if no improvement, the patient is to notify the clinic - Educate her on potential antibiotic side effects like yeast infections; instruct her to contact the clinic if symptoms occur - Prescribe high-dose Motrin for pain and swelling relief      Relevant Medications   ibuprofen (ADVIL) 800 MG tablet     Tollie Eth, DNP, AGNP-c   History, Medications, Surgery, SDOH, and Family History reviewed and updated as  appropriate.

## 2023-06-03 NOTE — Patient Instructions (Signed)
I have sent in antibiotics for you. Go ahead and start this immediately when you get them.   You can use a soft ice pack to help with the pain and swelling. Be sure to wrap it in a thin towel before putting to the skin.   If you have any worsening symptoms like fever, chills, nausea, vomiting, diarrhea, or dizziness please go to the emergency room. This can be a sign of severe infection.   If you are not having improvement of your symptoms by Thursday, please let me know.   Cellulitis, Adult  Cellulitis is a skin infection. The infected area is usually warm, red, swollen, and tender. It most commonly occurs on the lower body, such as the legs, feet, and toes, but this condition can occur on any part of the body. The infection can travel to the muscles, blood, and underlying tissue and become life-threatening without treatment. It is important to get medical treatment right away for this condition. What are the causes? Cellulitis is caused by bacteria. The bacteria enter through a break in the skin, such as a cut, burn, insect or animal bite, open sore, or crack. What increases the risk? This condition is more likely to occur in people who: Have a weak body's defense system (immune system). Are older than 25 years old. Have diabetes. Have a type of long-term (chronic) liver disease (cirrhosis) or kidney disease. Are obese. Have a skin condition such as: An itchy rash, such as eczema or psoriasis. A fungal rash on the feet or in skinfolds. Blistering rashes, such as shingles or chickenpox. Slow movement of blood in the veins (venous stasis). Fluid buildup below the skin (edema). Have open wounds on the skin, such as cuts, puncture wounds, burns, bites, scrapes, tattoos, piercings, or wounds from surgery. Have had radiation therapy. Use IV drugs. What are the signs or symptoms? Symptoms of this condition include: Skin that looks red, purple, or slightly darker than your usual skin  color. Streaks or spots on the skin. Swollen area of the skin. Tenderness or pain when an area of the skin is touched. Warm skin. Fever or chills. Blisters. Tiredness (fatigue). How is this diagnosed? This condition is diagnosed based on a medical history and physical exam. You may also have tests, including: Blood tests. Imaging tests. Tests on a sample of fluid taken from the wound (wound culture). How is this treated? Treatment for this condition may include: Medicines. These may include antibiotics or medicines to treat allergies (antihistamines). Rest. Applying cold or warm wet cloths (compresses) to the skin. If the condition is severe, you may need to stay in the hospital and get antibiotics through an IV. The infection usually starts to get better within 1-2 days of treatment. Follow these instructions at home: Medicines Take over-the-counter and prescription medicines only as told by your health care provider. If you were prescribed antibiotics, take them as told by your provider. Do not stop using the antibiotic even if you start to feel better. General instructions Drink enough fluid to keep your pee (urine) pale yellow. Do not touch or rub the infected area. Raise (elevate) the infected area above the level of your heart while you are sitting or lying down. Return to your normal activities as told by your provider. Ask your provider what activities are safe for you. Apply warm or cold compresses to the affected area as told by your provider. Keep all follow-up visits. Your provider will need to make sure that a more  serious infection is not developing. Contact a health care provider if: You have a fever. Your symptoms do not improve within 1-2 days of starting treatment or you develop new symptoms. Your bone or joint underneath the infected area becomes painful after the skin has healed. Your infection returns in the same area or another area. Signs of this may  include: You notice a swollen bump in the infected area. Your red area gets larger, turns dark in color, or becomes more painful. Drainage increases. Pus or a bad smell develops in your infected area. You have more pain. You feel ill and have muscle aches and weakness. You develop vomiting or diarrhea that will not go away. Get help right away if: You notice red streaks coming from the infected area. You notice the skin turns purple or black and falls off. This symptom may be an emergency. Get help right away. Call 911. Do not wait to see if the symptom will go away. Do not drive yourself to the hospital. This information is not intended to replace advice given to you by your health care provider. Make sure you discuss any questions you have with your health care provider. Document Revised: 07/09/2022 Document Reviewed: 07/09/2022 Elsevier Patient Education  2024 [employer].

## 2023-06-17 ENCOUNTER — Telehealth: Payer: Self-pay | Admitting: Family Medicine

## 2023-06-17 NOTE — Telephone Encounter (Signed)
Pt took meds for breast lump and it did not go away.  Please order mammogram.  Also pt needs something for yeast infection/uti from medication to [facility] on [address]  I advised pt [facility] was out until later this week.  Will send to [facility] for RX

## 2023-06-19 ENCOUNTER — Ambulatory Visit (INDEPENDENT_AMBULATORY_CARE_PROVIDER_SITE_OTHER): Payer: BC Managed Care – PPO | Admitting: Family Medicine

## 2023-06-19 ENCOUNTER — Encounter: Payer: Self-pay | Admitting: Family Medicine

## 2023-06-19 VITALS — BP 102/70 | HR 72 | Temp 98.5°F | Ht 65.0 in | Wt 125.0 lb

## 2023-06-19 DIAGNOSIS — N6323 Unspecified lump in the left breast, lower outer quadrant: Secondary | ICD-10-CM | POA: Diagnosis not present

## 2023-06-19 NOTE — Progress Notes (Signed)
Chief Complaint  Patient presents with   Breast Problem    Saw [facility] for L breast cellulitis 06/03/23 and was given some abx, only helped a little. Still having buldge/pain in left breast.   She saw [facility] 7/9 and diagnosed with cellulitis of L breast. She was treated with Keflex TID x 7 days. (Her note is not available for review).  Patient states the antibiotic helped with the inflammation. It is less swollen, smaller. It is intermittently painful. The warmth/heat has resolved. Never had any drainage, wound, abscess.  She has nipple piercing, didn't remove the piercing. She states that it was pierced too close to milk duct--has some intermittent white drainage. No issues from R nipple piercing.  Had infection in same breast (different area) 2 years ago.  Had it drained.  Had the mammo and aspiration done through [facility].     PMH, PSH, SH reviewed  Outpatient Encounter Medications as of 06/19/2023  Medication Sig Note   cephALEXin (KEFLEX) 500 MG capsule Take 1 capsule (500 mg total) by mouth 3 (three) times daily. (Patient not taking: Reported on 06/19/2023)    ibuprofen (ADVIL) 800 MG tablet Take 1 tablet (800 mg total) by mouth every 8 (eight) hours as needed for moderate pain. (Patient not taking: Reported on 06/19/2023) 06/19/2023: As needed, none in the last 24hrs   No facility-administered encounter medications on file as of 06/19/2023.   Allergies  Allergen Reactions   Nickel    ROS: No fever, chills, n/v/d Some vaginal itching after ABX, resolved on its own. Breast lump per HPI.   PHYSICAL EXAM:  BP 102/70   Pulse 72   Temp 98.5 F (36.9 C) (Tympanic)   Ht 5\' 5"  (1.651 m)   Wt 125 lb (56.7 kg)   LMP 06/11/2023 (Exact Date)   BMI 20.80 kg/m   Pleasant, well-appearing female in no distress Breasts--nipple piercings bilaterally. No nipple drainage, redness/discharge. Left-- 5-5.5 x 7 cm mass in the left breast, 3-6 o'clock (entire inferior outer quadrant). There  is a 1-1.5 cm more superficial lump that is tender, inferiorly at the left breast. No overlying skin erythema, warmth or visible rash. No other palpable abnormality present. No axillary lymphadenopathy. Normal right breast exam.   ASSESSMENT/PLAN:  Mass of lower outer quadrant of left breast - possibly related to recent mastitis. no e/o ongoing bacterial infection.  Check mammo/US ([facility]). Warm compresses   Left breast abnormality/lump.  Poss healing mastitis.  May have  a simple cyst as well. Evaluate with mammogram and ultrasound.  Scheduled with [facility].

## 2023-06-19 NOTE — Patient Instructions (Signed)
We are referring you for mammogram and ultrasound to get a better look at what is causing the swelling/lump in the left breast. I suspect that it is related to a recent infection. Without pain, fever, and getting worse, I don't think there is an active infection currently. Use moist heat to see if that helps with the hard areas.  Contact us right away if you have fever, redness, increased pain, or swollen glands in the armpit.  I think there may also be an area of a breast cyst (the smaller lump at the bottom, that was tender to touch.

## 2023-07-06 NOTE — Progress Notes (Unsigned)
No chief complaint on file.   Female partners  Last had STD testing done 03/2022 (HIV, RPR, GC and chlamydia; had Hep C screen in 03/2021)   PMH, PSH, SH reviewed   ROS:   PHYSICAL EXAM:  LMP 06/11/2023 (Exact Date)   Wt Readings from Last 3 Encounters:  06/19/23 125 lb (56.7 kg)  06/03/23 124 lb 6.4 oz (56.4 kg)  04/08/22 113 lb 12.8 oz (51.6 kg)       ASSESSMENT/PLAN:   DID SHE EVER GET MAMMO AND ULTRASOUND DONE AT [facility]?? (SEEN 7/25 WITH L BREAST MASS, SUSPECTED POSS ABSCESS, AND HAD SET UP APPT--NO RESULT RECEIVED?  DONE??)

## 2023-07-07 ENCOUNTER — Encounter: Payer: Self-pay | Admitting: Family Medicine

## 2023-07-07 ENCOUNTER — Ambulatory Visit (INDEPENDENT_AMBULATORY_CARE_PROVIDER_SITE_OTHER): Payer: BC Managed Care – PPO | Admitting: Family Medicine

## 2023-07-07 VITALS — BP 112/76 | HR 95 | Resp 14 | Wt 125.6 lb

## 2023-07-07 DIAGNOSIS — B3731 Acute candidiasis of vulva and vagina: Secondary | ICD-10-CM | POA: Diagnosis not present

## 2023-07-07 DIAGNOSIS — N6323 Unspecified lump in the left breast, lower outer quadrant: Secondary | ICD-10-CM

## 2023-07-07 DIAGNOSIS — Z202 Contact with and (suspected) exposure to infections with a predominantly sexual mode of transmission: Secondary | ICD-10-CM | POA: Diagnosis not present

## 2023-07-07 DIAGNOSIS — N898 Other specified noninflammatory disorders of vagina: Secondary | ICD-10-CM | POA: Diagnosis not present

## 2023-07-07 MED ORDER — FLUCONAZOLE 150 MG PO TABS: 150.0000 mg | ORAL_TABLET | Freq: Once | ORAL | 0 refills | Status: AC

## 2023-07-08 DIAGNOSIS — N61 Mastitis without abscess: Secondary | ICD-10-CM | POA: Insufficient documentation

## 2023-07-08 NOTE — Assessment & Plan Note (Signed)
The patient presents with a tender, erythematous lump in the left breast near the areola on the 12  oclock position. She reports a history of a similar lump in 2022 that required aspiration. No fever, chills, or nausea are present. The patient has breast piercings with some leakage reported with no drainage present at this time. Area is warm, but soft.   Plan: - Prescribe Keflex for possible bacterial infection with MRSA coverage - Monitor for improvement by Thursday or Friday; if no improvement, the patient is to notify the clinic - Educate her on potential antibiotic side effects like yeast infections; instruct her to contact the clinic if symptoms occur - Prescribe high-dose Motrin for pain and swelling relief

## 2023-07-14 MED ORDER — METRONIDAZOLE 500 MG PO TABS: 500.0000 mg | ORAL_TABLET | Freq: Two times a day (BID) | ORAL | 0 refills | Status: AC

## 2023-07-14 NOTE — Addendum Note (Signed)
Addended byJoselyn Arrow on: 07/14/2023 07:40 PM   Modules accepted: Orders

## 2023-08-14 ENCOUNTER — Encounter: Payer: BC Managed Care – PPO | Admitting: Nurse Practitioner
